# Patient Record
Sex: Female | Born: 1935 | Race: White | Hispanic: No | Smoking: Never smoker
Health system: Southern US, Community
[De-identification: ages and names within clinical notes are randomized; demographics above are authoritative.]

## PROBLEM LIST (undated history)

## (undated) DIAGNOSIS — E785 Hyperlipidemia, unspecified: Secondary | ICD-10-CM

## (undated) DIAGNOSIS — K649 Unspecified hemorrhoids: Secondary | ICD-10-CM

## (undated) DIAGNOSIS — I10 Essential (primary) hypertension: Secondary | ICD-10-CM

## (undated) DIAGNOSIS — G8929 Other chronic pain: Secondary | ICD-10-CM

## (undated) HISTORY — PX: CHOLECYSTECTOMY: SHX55

---

## 1998-06-05 ENCOUNTER — Observation Stay (HOSPITAL_COMMUNITY): Admission: RE | Admit: 1998-06-05 | Discharge: 1998-06-06 | Payer: Self-pay | Admitting: Cardiovascular Disease

## 1998-10-29 ENCOUNTER — Ambulatory Visit (HOSPITAL_COMMUNITY): Admission: RE | Admit: 1998-10-29 | Discharge: 1998-10-29 | Payer: Self-pay | Admitting: Internal Medicine

## 1998-11-12 ENCOUNTER — Encounter: Payer: Self-pay | Admitting: Cardiovascular Disease

## 1998-11-12 ENCOUNTER — Observation Stay (HOSPITAL_COMMUNITY): Admission: RE | Admit: 1998-11-12 | Discharge: 1998-11-13 | Payer: Self-pay | Admitting: Cardiovascular Disease

## 1998-11-18 ENCOUNTER — Ambulatory Visit (HOSPITAL_COMMUNITY): Admission: RE | Admit: 1998-11-18 | Discharge: 1998-11-18 | Payer: Self-pay | Admitting: Internal Medicine

## 1998-11-18 ENCOUNTER — Encounter: Payer: Self-pay | Admitting: Internal Medicine

## 1998-12-24 ENCOUNTER — Ambulatory Visit (HOSPITAL_COMMUNITY): Admission: RE | Admit: 1998-12-24 | Discharge: 1998-12-24 | Payer: Self-pay | Admitting: Gastroenterology

## 1999-02-11 ENCOUNTER — Ambulatory Visit (HOSPITAL_COMMUNITY): Admission: RE | Admit: 1999-02-11 | Discharge: 1999-02-11 | Payer: Self-pay | Admitting: Gastroenterology

## 1999-11-22 ENCOUNTER — Ambulatory Visit (HOSPITAL_COMMUNITY): Admission: RE | Admit: 1999-11-22 | Discharge: 1999-11-22 | Payer: Self-pay | Admitting: Internal Medicine

## 1999-11-22 ENCOUNTER — Encounter: Payer: Self-pay | Admitting: Internal Medicine

## 2000-11-22 ENCOUNTER — Encounter: Payer: Self-pay | Admitting: Internal Medicine

## 2000-11-22 ENCOUNTER — Ambulatory Visit (HOSPITAL_COMMUNITY): Admission: RE | Admit: 2000-11-22 | Discharge: 2000-11-22 | Payer: Self-pay | Admitting: Internal Medicine

## 2000-11-29 ENCOUNTER — Encounter: Admission: RE | Admit: 2000-11-29 | Discharge: 2000-11-29 | Payer: Self-pay | Admitting: Internal Medicine

## 2000-11-29 ENCOUNTER — Encounter: Payer: Self-pay | Admitting: Internal Medicine

## 2001-11-23 ENCOUNTER — Encounter: Payer: Self-pay | Admitting: Internal Medicine

## 2001-11-23 ENCOUNTER — Ambulatory Visit (HOSPITAL_COMMUNITY): Admission: RE | Admit: 2001-11-23 | Discharge: 2001-11-23 | Payer: Self-pay | Admitting: Internal Medicine

## 2002-07-18 ENCOUNTER — Encounter: Admission: RE | Admit: 2002-07-18 | Discharge: 2002-07-18 | Payer: Self-pay | Admitting: Internal Medicine

## 2002-07-18 ENCOUNTER — Encounter: Payer: Self-pay | Admitting: Internal Medicine

## 2002-08-05 ENCOUNTER — Encounter: Admission: RE | Admit: 2002-08-05 | Discharge: 2002-08-05 | Payer: Self-pay | Admitting: Internal Medicine

## 2002-08-05 ENCOUNTER — Encounter: Payer: Self-pay | Admitting: Internal Medicine

## 2002-08-20 ENCOUNTER — Ambulatory Visit (HOSPITAL_COMMUNITY): Admission: RE | Admit: 2002-08-20 | Discharge: 2002-08-20 | Payer: Self-pay | Admitting: Gastroenterology

## 2002-12-06 ENCOUNTER — Encounter: Admission: RE | Admit: 2002-12-06 | Discharge: 2002-12-06 | Payer: Self-pay | Admitting: Internal Medicine

## 2002-12-06 ENCOUNTER — Encounter: Payer: Self-pay | Admitting: Internal Medicine

## 2003-12-23 ENCOUNTER — Encounter: Admission: RE | Admit: 2003-12-23 | Discharge: 2003-12-23 | Payer: Self-pay | Admitting: Internal Medicine

## 2004-12-23 ENCOUNTER — Ambulatory Visit (HOSPITAL_COMMUNITY): Admission: RE | Admit: 2004-12-23 | Discharge: 2004-12-23 | Payer: Self-pay | Admitting: Internal Medicine

## 2005-06-06 ENCOUNTER — Ambulatory Visit (HOSPITAL_COMMUNITY): Admission: RE | Admit: 2005-06-06 | Discharge: 2005-06-06 | Payer: Self-pay | Admitting: Ophthalmology

## 2005-12-28 ENCOUNTER — Ambulatory Visit (HOSPITAL_COMMUNITY): Admission: RE | Admit: 2005-12-28 | Discharge: 2005-12-28 | Payer: Self-pay | Admitting: Internal Medicine

## 2006-03-08 ENCOUNTER — Inpatient Hospital Stay (HOSPITAL_COMMUNITY): Admission: EM | Admit: 2006-03-08 | Discharge: 2006-03-14 | Payer: Self-pay | Admitting: Emergency Medicine

## 2006-11-30 ENCOUNTER — Encounter: Admission: RE | Admit: 2006-11-30 | Discharge: 2006-11-30 | Payer: Self-pay | Admitting: Internal Medicine

## 2007-01-01 ENCOUNTER — Ambulatory Visit (HOSPITAL_COMMUNITY): Admission: RE | Admit: 2007-01-01 | Discharge: 2007-01-01 | Payer: Self-pay | Admitting: Internal Medicine

## 2007-01-09 ENCOUNTER — Encounter: Admission: RE | Admit: 2007-01-09 | Discharge: 2007-01-09 | Payer: Self-pay | Admitting: Internal Medicine

## 2007-07-05 ENCOUNTER — Encounter: Admission: RE | Admit: 2007-07-05 | Discharge: 2007-07-05 | Payer: Self-pay | Admitting: Internal Medicine

## 2007-08-25 ENCOUNTER — Encounter (INDEPENDENT_AMBULATORY_CARE_PROVIDER_SITE_OTHER): Payer: Self-pay | Admitting: Emergency Medicine

## 2007-08-25 ENCOUNTER — Ambulatory Visit: Payer: Self-pay | Admitting: Surgery

## 2007-08-25 ENCOUNTER — Emergency Department (HOSPITAL_COMMUNITY): Admission: EM | Admit: 2007-08-25 | Discharge: 2007-08-25 | Payer: Self-pay | Admitting: Emergency Medicine

## 2008-06-13 ENCOUNTER — Encounter: Admission: RE | Admit: 2008-06-13 | Discharge: 2008-06-13 | Payer: Self-pay | Admitting: Internal Medicine

## 2009-06-15 ENCOUNTER — Ambulatory Visit (HOSPITAL_COMMUNITY): Admission: RE | Admit: 2009-06-15 | Discharge: 2009-06-15 | Payer: Self-pay | Admitting: Internal Medicine

## 2010-09-20 ENCOUNTER — Emergency Department (HOSPITAL_COMMUNITY): Admission: EM | Admit: 2010-09-20 | Discharge: 2010-09-20 | Payer: Self-pay | Admitting: Emergency Medicine

## 2010-09-22 ENCOUNTER — Inpatient Hospital Stay (HOSPITAL_COMMUNITY): Admission: EM | Admit: 2010-09-22 | Discharge: 2010-09-26 | Payer: Self-pay | Admitting: Emergency Medicine

## 2010-09-25 ENCOUNTER — Encounter (INDEPENDENT_AMBULATORY_CARE_PROVIDER_SITE_OTHER): Payer: Self-pay

## 2011-03-09 LAB — CBC
HCT: 42.5 % (ref 36.0–46.0)
Hemoglobin: 14.6 g/dL (ref 12.0–15.0)
RBC: 5.03 MIL/uL (ref 3.87–5.11)
WBC: 6.9 10*3/uL (ref 4.0–10.5)

## 2011-03-09 LAB — BASIC METABOLIC PANEL
BUN: 8 mg/dL (ref 6–23)
CO2: 27 mEq/L (ref 19–32)
Calcium: 9.2 mg/dL (ref 8.4–10.5)
Chloride: 94 mEq/L — ABNORMAL LOW (ref 96–112)
Creatinine, Ser: 0.73 mg/dL (ref 0.4–1.2)
GFR calc non Af Amer: >60 mL/min (ref >60)
Potassium: 3.6 mEq/L (ref 3.5–5.1)

## 2011-03-10 LAB — COMPREHENSIVE METABOLIC PANEL
ALT: 14 U/L (ref 0–35)
ALT: 15 U/L (ref 0–35)
ALT: 16 U/L (ref 0–35)
AST: 16 U/L (ref 0–37)
Albumin: 3.3 g/dL — ABNORMAL LOW (ref 3.5–5.2)
Alkaline Phosphatase: 59 U/L (ref 39–117)
BUN: 12 mg/dL (ref 6–23)
CO2: 27 mEq/L (ref 19–32)
CO2: 27 mEq/L (ref 19–32)
Calcium: 8.7 mg/dL (ref 8.4–10.5)
Calcium: 9.1 mg/dL (ref 8.4–10.5)
Calcium: 9.2 mg/dL (ref 8.4–10.5)
Chloride: 96 mEq/L (ref 96–112)
Creatinine, Ser: 0.85 mg/dL (ref 0.4–1.2)
GFR calc Af Amer: >60 mL/min (ref >60)
GFR calc Af Amer: >60 mL/min (ref >60)
GFR calc non Af Amer: >60 mL/min (ref >60)
Glucose, Bld: 115 mg/dL — ABNORMAL HIGH (ref 70–99)
Glucose, Bld: 147 mg/dL — ABNORMAL HIGH (ref 70–99)
Potassium: 4.5 mEq/L (ref 3.5–5.1)
Sodium: 128 mEq/L — ABNORMAL LOW (ref 135–145)
Sodium: 130 mEq/L — ABNORMAL LOW (ref 135–145)
Total Bilirubin: 0.6 mg/dL (ref 0.3–1.2)
Total Protein: 6.1 g/dL (ref 6.0–8.3)

## 2011-03-10 LAB — DIFFERENTIAL
Basophils Absolute: 0 10*3/uL (ref 0.0–0.1)
Basophils Absolute: 0 10*3/uL (ref 0.0–0.1)
Basophils Relative: 0 % (ref 0–1)
Basophils Relative: 0 % (ref 0–1)
Eosinophils Absolute: 0.1 10*3/uL (ref 0.0–0.7)
Eosinophils Absolute: 0.1 10*3/uL (ref 0.0–0.7)
Eosinophils Relative: 1 % (ref 0–5)
Eosinophils Relative: 2 % (ref 0–5)
Lymphocytes Relative: 18 % (ref 12–46)
Lymphs Abs: 1.2 10*3/uL (ref 0.7–4.0)
Lymphs Abs: 1.5 10*3/uL (ref 0.7–4.0)
Monocytes Absolute: 0.9 10*3/uL (ref 0.1–1.0)
Monocytes Relative: 11 % (ref 3–12)
Neutro Abs: 5.7 10*3/uL (ref 1.7–7.7)
Neutrophils Relative %: 69 % (ref 43–77)
Neutrophils Relative %: 69 % (ref 43–77)

## 2011-03-10 LAB — URINALYSIS, ROUTINE W REFLEX MICROSCOPIC
Bilirubin Urine: NEGATIVE
Glucose, UA: NEGATIVE mg/dL
Hgb urine dipstick: NEGATIVE
Ketones, ur: NEGATIVE mg/dL
Nitrite: NEGATIVE
Protein, ur: NEGATIVE mg/dL
Specific Gravity, Urine: 1.017 (ref 1.005–1.030)
Urobilinogen, UA: 0.2 mg/dL (ref 0.0–1.0)
pH: 7 (ref 5.0–8.0)

## 2011-03-10 LAB — CBC
HCT: 40.8 % (ref 36.0–46.0)
HCT: 40.9 % (ref 36.0–46.0)
Hemoglobin: 13.7 g/dL (ref 12.0–15.0)
Hemoglobin: 14 g/dL (ref 12.0–15.0)
Hemoglobin: 14.2 g/dL (ref 12.0–15.0)
MCH: 29.1 pg (ref 26.0–34.0)
MCH: 29.6 pg (ref 26.0–34.0)
MCHC: 33.5 g/dL (ref 30.0–36.0)
MCHC: 34.8 g/dL (ref 30.0–36.0)
MCV: 85 fL (ref 78.0–100.0)
MCV: 86.8 fL (ref 78.0–100.0)
Platelets: 205 K/uL (ref 150–400)
Platelets: 229 10*3/uL (ref 150–400)
RBC: 4.71 MIL/uL (ref 3.87–5.11)
RBC: 4.8 MIL/uL (ref 3.87–5.11)
RDW: 15 % (ref 11.5–15.5)
WBC: 6.4 K/uL (ref 4.0–10.5)

## 2011-03-10 LAB — COMPREHENSIVE METABOLIC PANEL WITH GFR
ALT: 17 U/L (ref 0–35)
AST: 19 U/L (ref 0–37)
Albumin: 3.5 g/dL (ref 3.5–5.2)
Alkaline Phosphatase: 69 U/L (ref 39–117)
BUN: 10 mg/dL (ref 6–23)
CO2: 27 meq/L (ref 19–32)
Calcium: 9 mg/dL (ref 8.4–10.5)
Chloride: 87 meq/L — ABNORMAL LOW (ref 96–112)
Creatinine, Ser: 0.85 mg/dL (ref 0.4–1.2)
GFR calc non Af Amer: >60 mL/min
Glucose, Bld: 152 mg/dL — ABNORMAL HIGH (ref 70–99)
Potassium: 3.8 meq/L (ref 3.5–5.1)
Sodium: 122 meq/L — ABNORMAL LOW (ref 135–145)
Total Bilirubin: 0.9 mg/dL (ref 0.3–1.2)
Total Protein: 6.4 g/dL (ref 6.0–8.3)

## 2011-03-10 LAB — LIPASE, BLOOD
Lipase: 38 U/L (ref 11–59)
Lipase: 43 U/L (ref 11–59)

## 2011-03-10 LAB — CK TOTAL AND CKMB (NOT AT ARMC)
CK, MB: 1 ng/mL (ref 0.3–4.0)
Total CK: 27 U/L (ref 7–177)

## 2011-03-10 LAB — PLATELET INHIBITION P2Y12
Platelet Function  P2Y12: 218 [PRU] (ref 194–418)
Platelet Function Baseline: 301 [PRU] (ref 194–418)

## 2011-03-10 LAB — TROPONIN I

## 2011-05-13 NOTE — H&P (Signed)
NAMEGEOFFREY, MANKIN NO.:  [id number]   MEDICAL RECORD NO.:  [id number]          PATIENT TYPE:  INP   LOCATION:  1826                         FACILITY:  [facility]   PHYSICIAN:  Isidor Holts, M.D.  DATE OF BIRTH:  1918-01-25   DATE OF ADMISSION:  03/08/2006  DATE OF DISCHARGE:                                HISTORY & PHYSICAL   PRIMARY MEDICAL DOCTOR:  Dr. Elmore Guise   CARDIOLOGIST:  Dr. Nanetta Batty   CHIEF COMPLAINT:  Constipation, fever, urinary incontinence, confusion.   HISTORY OF PRESENT ILLNESS:  This is an 77 year old female. For past medical  history, see below. History is supplied by the patient and her daughter. The  patient states that she had been constipated approximately 1 week ago for 3  days. She took some OTC laxatives, including Milk of Magnesia. This resulted  in bowel movement, but the patient has after that, become progressively  weak. Has had fever/chills for approximately 1 week and in a.m. of March 08, 2006, her neighbors checked on her and found her looking unwell and called  her daughter, who went over and found the patient confused, feverish, and  incontinent of urine. She took the patient to her PMD's office. Temperature  was found to be 102 degrees. Her PMD referred her to the emergency  department.   PAST MEDICAL HISTORY:  1.  Hypertension.  2.  Ulcerative colitis.  3.  DJD.  4.  Glaucoma.  5.  Hiatal hernia.  6.  Peripheral vascular disease, status post left carotid stent      approximately 5 years ago.  7.  Dyslipidemia.  8.  Anxiety.  9.  Colon polyp (adenoma) 2000. This was removed. The patient is status post      negative colonoscopy August 2003.  10. Status post right total knee replacement.   MEDICATIONS:  1.  Plavix 75 mg p.o. daily.  2.  Aspirin 81 mg p.o. daily.  3.  Caduet (5/20) one p.o. daily.  4.  Diovan HCT (320/25) one p.o. daily.  5.  Klonopin 1 mg p.o. q.h.s.   ALLERGIES:  1.  PENICILLIN.  2.   NUBAIN.  3.  CODEINE.  4.  CIPROFLOXACIN.  All of these cause itching.  1.  ZESTRIL. This causes a bad cough.   SYSTEMS REVIEW:  As per HPI and chief complaint. The patient feels  nauseated. Denies cough or shortness of breath. Denies chest pain. Denies  vomiting or diarrhea. Has occasional abdominal pain. Systems review is  otherwise negative.   SOCIAL HISTORY:  The patient is widowed. She lives alone, but neighbors  check in on her daily and her daughter visits often. She is an ex-smoker,  quit approximately 20 years ago. Denies alcohol use or drug abuse. She has  one daughter who is alive and well. She has two sisters, i.e. one full  sister and one half sister, who have arthritis but are otherwise healthy.   FAMILY HISTORY:  Otherwise noncontributory.   PHYSICAL EXAMINATION:  VITAL SIGNS:  T-max 104.1, pulse 101 per minute and  regular, respiratory  rate 22, blood pressure 108/56 mmHg initially,  rechecked with blood pressure 76 mmHg. Pulse oximeter 93% on room air.  GENERAL:  The patient appears alert, communicative, intermittently confused,  not short of breath at rest.  HEENT:  No clinical pallor, no jaundice, no conjunctival injection. Throat  appears quite clear.  NECK:  Supple, JVP not seen. No palpable lymphadenopathy, no palpable  goiter, no carotid bruits.  CHEST:  Clinically clear to auscultation. No wheezes or crackles. Heart  sounds 1 and 2 heard. Normal rate and rhythm, no murmurs.  ABDOMEN:  Obese, soft and nontender. There is no palpable organomegaly, no  palpable masses, normal bowel sounds.  LOWER EXTREMITIES:  No pitting edema, palpable peripheral pulses.  MUSCULOSKELETAL:  Vertical scar is noted over the right knee anteriorly.  Otherwise, she has generalized osteoarthritic changes.  CENTRAL NERVOUS SYSTEM:  No focal neurologic deficit on gross examination.   INVESTIGATIONS:  CBC:  WBC 18.3, hemoglobin 12.6, hematocrit 37.5, platelets  236. Electrolytes:   Sodium 137, potassium 3.1, chloride 101, CO2 23, BUN 31,  creatinine 1.7, glucose 168. Urinalysis shows wbc too numerous to count, rbc  21-50, bacteria many.   ASSESSMENT AND PLAN:  1.  Acute pyelonephritis with possible sepsis syndrome. As evidenced by      pyrexia, elevated WBC, tachycardia, and hypotension. We shall start      intravenous fluid hydration, initiate septic workup including blood      cultures and urine cultures, and do chest x-ray for completeness,      although no respiratory tract symptoms are noted. We have discussed      antibiotic choice in detail with pharmacy, as the patient has pruritus      to PENICILLIN and the QUINOLONES. Recommendation is to try Rocephin.      Creatinine is elevated, militating against the use of aminoglycosides.   1.  Dehydration/Arenal insufficiency. As evidenced by high BUN/creatinine      ratio and hypotension. We shall manage with aggressive intravenous fluid      hydration, admit the patient to stepdown unit for close observation, and      maintain a low threshold for institution of pressors.   1.  Ulcerative colitis. This appears quiescent at present.   1.  Dyslipidemia. Continue pre-admission lipid-lowering medication.   1.  History of hypertension. In view of hypotension, will hold      antihypertensives for now.   Will institute gastrointestinal prophylaxis with proton pump inhibitor and  deep venous thrombosis prophylaxis with Lovenox.   Further management will depend on clinical course.      Isidor Holts, M.D.  Electronically Signed     CO/MEDQ  D:  03/08/2006  T:  03/08/2006  Job:  [id number]   cc:   Erskine Speed, M.D.  Fax: [phone]

## 2011-05-13 NOTE — Discharge Summary (Signed)
[name], [name] NO.:  [id number]   MEDICAL RECORD NO.:  [id number]          PATIENT TYPE:  INP   LOCATION:  3037                         FACILITY:  [facility]   PHYSICIAN:  Hettie Holstein, D.O.    DATE OF BIRTH:  1918/01/29   DATE OF ADMISSION:  03/08/2006  DATE OF DISCHARGE:  03/12/2006                                 DISCHARGE SUMMARY   PRIMARY CARE PHYSICIAN:  Dr. Elmore Guise.   REASON FOR ADMISSION:  Fever, urinary incontinence and confusion.   PRINCIPAL DIAGNOSIS:  Septic shock, Escherichia coli, status post resolution  with intensive care unit course requiring pressor therapy.   MEDICATIONS ON DISCHARGE:  The patient should take -  1.  Keflex 500 mg b.i.d., end date of March 22, 2006.  2.  Glucophage 850 mg daily.  3.  Glipizide 2.5 mg daily.  4.  Albuterol metered dose inhaler two puffs q.i.d. per days as needed.  In addition, she should continue her home medications of -  1.  Plavix 75 mg daily.  2.  Aspirin 81 mg daily.  3.  Diovan HCT 320/25 daily.  4.  Klonopin 1 mg q.h.s.  5.  Caduet 5/20 daily.   ADDITIONAL MEDICAL DIAGNOSES:  1.  History of hypertension.  2.  History of ulcerative colitis.  3.  Peripheral vascular disease, status post left carotid stent 5 years ago.  4.  Anxiety.  5.  Colon polyps.  6.  Hiatal hernia.  7.  Glaucoma.  8.  Status post right total knee replacement.   HISTORY OF PRESENT ILLNESS:  Please refer to history and physical as  dictated by Dr. Isidor Holts; however, [name] is a pleasant 77-year-  old independently-living female with a past medical history significant for  peripheral vascular disease status post left carotid stent, who presented  with constipation, in addition to progressive weakness, fever and chills,  and confusion as reported by her daughter.  She was seen in her primary care  physician's office and her temperature was 102.  She was referred to the  emergency department.   HOSPITAL  COURSE:  She was admitted and initially started on fluid.  She was  hypotensive and then required Levophed, as well as dopamine, and aggressive  fluid hydration.  Her course was than of gradual improvement and weaning of  her pressor.  She was discovered to positive blood cultures for pansensitive  E. coli.  She responded to Rocephin.  She had multiple drug allergy  listings; however, she tolerated both Rocephin, as well as a transition into  her Keflex.  Her functional status is quite good, though she is  deconditioned at this point, and she has newly-  diagnosed diabetes mellitus with a hemoglobin A1C this admission of 6.9.  She is being transitioned to oral therapy, and I suspect that she will be  able to be discharged in the morning if she does continue to have  improvement in her functional status.      Hettie Holstein, D.O.  Electronically Signed     ESS/MEDQ  D:  03/12/2006  T:  03/12/2006  Job:  [id number]   cc:   Erskine Speed, M.D.  Fax: [phone]

## 2011-05-13 NOTE — Op Note (Signed)
[name], [name]                ACCOUNT NO.:  [id number]   MEDICAL RECORD NO.:  [id number]          PATIENT TYPE:  OIB   LOCATION:  2550                         FACILITY:  [facility]   PHYSICIAN:  Alford Highland. Rankin, M.D.   DATE OF BIRTH:  1918/04/24   DATE OF PROCEDURE:  06/06/2005  DATE OF DISCHARGE:                                 OPERATIVE REPORT   PREOPERATIVE DIAGNOSIS:  Epiretinal membrane right eye.   POSTOPERATIVE DIAGNOSES:  Epiretinal membrane right eye.   PROCEDURES:  1.  Posterior vitrectomy with membrane peel OD - 25 gauge.   SURGEON:  Alford Highland. Rankin, M.D.   ANESTHESIA:  Local with monitored anesthesia control.   INDICATION FOR PROCEDURE:  The patient is an 77 year-old woman who has  significant visual loss in the right eye on the basis of idiopathic  epiretinal membrane.  She understands this is an attempt to remove the  epiretinal membrane so as to allow for visual acuity function to improve.  She understands the risks of anesthesia, including rare occurrence of death,  loss to the eye including but not limited to hemorrhage, infection,  scarring, need for further surgery, no change in vision, loss of vision, and  progressive disease despite intervention.  After appropriate signed consent  was obtained, the patient was taken to the operating room.  In the operating  room, appropriate monitors were followed by mild sedation.  0.75% Marcaine  was delivered 5 ml retrobulbar, an additional 5 ml laterally fashioned in  modified [employer]  The right periocular region was sterilely prepped and  draped in the usual ophthalmic fashion.  The microscope was placed in  position, with the biom attached.  A 25-gauge trocar system was then used,  and the initial trocar was placed in the inferotemporal quadrant 3.5 mm  posterior to the limbus.  Placement in the vitreous cavity was verified  visually.  The superior trocars were then placed.  A 25-gauge core  vitrectomy was then begun.   Posterior vitreous was detected and had  spontaneously detached, and the vitreousskirt circumcised 360 degrees.  At  this time, 25-gauge forceps were then used to engage the internal limiting  membrane component of the epiretinal membrane.  All striae of the macular  and perimacular region were removed in this fashion.  No complications  occurred.  At this time, the peripheral retina was inspected and found to be  free of retinal holes or tears.  Under low infusion pressures, trocar system  was removed.  Subconjunctival Decadron was placed inferonasally.   A sterile patch and Fox shield applied to the right eye.  The patient was  taken to the outpatient area and discharged home as an outpatient.      Alford Highland Rankin, M.D.  Electronically Signed    GAR/MEDQ  D:  06/06/2005  T:  06/06/2005  Job:  [id number]

## 2011-05-13 NOTE — Discharge Summary (Signed)
NAMEATALIA, [name] NO.:  [id number]   MEDICAL RECORD NO.:  [id number]          PATIENT TYPE:  INP   LOCATION:  3037                         FACILITY:  [facility]   PHYSICIAN:  Isidor Holts, M.D.  DATE OF BIRTH:  09-01-1918   DATE OF ADMISSION:  03/08/2006  DATE OF DISCHARGE:  03/14/2006                                 DISCHARGE SUMMARY   PRIMARY MEDICAL DOCTOR:  Dr. Elmore Guise.   DISCHARGE DIAGNOSIS:  Refer to interim discharge summary dated March 12, 2006 by Dr. Hannah Beat.   DISCHARGE MEDICATIONS:  Refer to above-mentioned interim discharge.  However, the following modifications have been made:  1.  Glucophage.  This has been discontinued, secondary to patient's age and      propensity for lactic acidosis.  2.  Glipizide.  This has been changed to 5 mg p.o. daily.  3.  Protonix 40 mg p.o. daily.   PROCEDURES:  Refer to interim discharge summary dated March 12, 2006.   ADMISSION HISTORY:  Refer to above-mentioned discharge summary.   CLINICAL COURSE:  Refer to interim discharge summary dated March 12, 2006 by  Dr. Theodoro Grist.  In addition, for the subsequent few days (March 13, 2006  through March 14, 2006), the patient remained clinically stable.  She was  euglycemic on a combination of glipizide and Glucophage.  However, in view  of the patient's age and the possibility of lactic acidosis secondary to  senile nephron loss, it is felt appropriate to discontinue the Glucophage.  The patient has therefore been placed on 5 mg of glipizide instead.  Arrangements have been put in place for discharge.  The patient appears to  have adequate support at home, and has been arranged a home health R.N.  She  has also been recommended to attend the [facility] diabetic teaching  service.  Their phone number is [phone].  She is recommended to follow up  with her primary M.D., Dr. Elmore Guise, within 1 week of discharge.      Isidor Holts, M.D.  Electronically  Signed     CO/MEDQ  D:  03/14/2006  T:  03/14/2006  Job:  [id number]   cc:   Erskine Speed, M.D.  Fax: [phone]

## 2011-10-07 LAB — URINE MICROSCOPIC-ADD ON

## 2011-10-07 LAB — URINALYSIS, ROUTINE W REFLEX MICROSCOPIC
Bilirubin Urine: NEGATIVE
Glucose, UA: NEGATIVE
Ketones, ur: NEGATIVE
Protein, ur: NEGATIVE
pH: 7

## 2012-11-23 ENCOUNTER — Observation Stay (HOSPITAL_COMMUNITY)
Admission: EM | Admit: 2012-11-23 | Discharge: 2012-11-24 | Disposition: A | Discharge status: Home / Self Care | Payer: Medicare Other | Attending: Internal Medicine | Admitting: Internal Medicine

## 2012-11-23 ENCOUNTER — Encounter (HOSPITAL_COMMUNITY): Payer: Self-pay | Admitting: Emergency Medicine

## 2012-11-23 ENCOUNTER — Emergency Department (HOSPITAL_COMMUNITY): Payer: Medicare Other

## 2012-11-23 DIAGNOSIS — I10 Essential (primary) hypertension: Secondary | ICD-10-CM | POA: Diagnosis present

## 2012-11-23 DIAGNOSIS — R5381 Other malaise: Secondary | ICD-10-CM | POA: Insufficient documentation

## 2012-11-23 DIAGNOSIS — K649 Unspecified hemorrhoids: Secondary | ICD-10-CM | POA: Diagnosis present

## 2012-11-23 DIAGNOSIS — Z7902 Long term (current) use of antithrombotics/antiplatelets: Secondary | ICD-10-CM | POA: Insufficient documentation

## 2012-11-23 DIAGNOSIS — K644 Residual hemorrhoidal skin tags: Secondary | ICD-10-CM | POA: Insufficient documentation

## 2012-11-23 DIAGNOSIS — K625 Hemorrhage of anus and rectum: Principal | ICD-10-CM | POA: Insufficient documentation

## 2012-11-23 DIAGNOSIS — E785 Hyperlipidemia, unspecified: Secondary | ICD-10-CM | POA: Insufficient documentation

## 2012-11-23 DIAGNOSIS — K922 Gastrointestinal hemorrhage, unspecified: Secondary | ICD-10-CM | POA: Diagnosis present

## 2012-11-23 DIAGNOSIS — Z79899 Other long term (current) drug therapy: Secondary | ICD-10-CM | POA: Insufficient documentation

## 2012-11-23 HISTORY — DX: Unspecified hemorrhoids: K64.9

## 2012-11-23 HISTORY — DX: Essential (primary) hypertension: I10

## 2012-11-23 HISTORY — DX: Hyperlipidemia, unspecified: E78.5

## 2012-11-23 LAB — POCT I-STAT, CHEM 8
BUN: 20 mg/dL (ref 6–23)
Chloride: 101 mEq/L (ref 96–112)
Creatinine, Ser: 1 mg/dL (ref 0.50–1.10)
Sodium: 139 mEq/L (ref 135–145)

## 2012-11-23 LAB — HEMOGLOBIN AND HEMATOCRIT, BLOOD: HCT: 41.7 % (ref 36.0–46.0)

## 2012-11-23 MED ORDER — ONDANSETRON HCL 4 MG/2ML IJ SOLN: 4.0000 mg | Freq: Four times a day (QID) | INTRAMUSCULAR | Status: DC | PRN

## 2012-11-23 MED ORDER — VALSARTAN 160 MG PO TABS
320.0000 mg | ORAL_TABLET | Freq: Every day | ORAL | Status: DC
  Filled 2012-11-23: qty 2

## 2012-11-23 MED ORDER — ADULT MULTIVITAMIN W/MINERALS CH
1.0000 | ORAL_TABLET | Freq: Every day | ORAL | Status: DC
  Filled 2012-11-23: qty 1

## 2012-11-23 MED ORDER — ATORVASTATIN CALCIUM 20 MG PO TABS
20.0000 mg | ORAL_TABLET | Freq: Every day | ORAL | Status: DC
  Filled 2012-11-23: qty 1

## 2012-11-23 MED ORDER — VALSARTAN-HYDROCHLOROTHIAZIDE 320-25 MG PO TABS: 1.0000 | ORAL_TABLET | Freq: Every day | ORAL | Status: DC

## 2012-11-23 MED ORDER — HYDROCHLOROTHIAZIDE 25 MG PO TABS
25.0000 mg | ORAL_TABLET | Freq: Every day | ORAL | Status: DC
  Filled 2012-11-23: qty 1

## 2012-11-23 MED ORDER — AMLODIPINE BESYLATE 5 MG PO TABS
5.0000 mg | ORAL_TABLET | Freq: Every day | ORAL | Status: DC
  Filled 2012-11-23: qty 1

## 2012-11-23 MED ORDER — SODIUM CHLORIDE 0.9 % IV SOLN
INTRAVENOUS | Status: DC
  Administered 2012-11-23: 50 mL/h via INTRAVENOUS

## 2012-11-23 MED ORDER — DOCUSATE SODIUM 100 MG PO CAPS: 100.0000 mg | ORAL_CAPSULE | Freq: Every day | ORAL | Status: DC | PRN

## 2012-11-23 MED ORDER — GUAIFENESIN-DM 100-10 MG/5ML PO SYRP
5.0000 mL | ORAL_SOLUTION | ORAL | Status: DC | PRN
  Filled 2012-11-23: qty 5

## 2012-11-23 MED ORDER — HYDROCODONE-ACETAMINOPHEN 5-325 MG PO TABS: 1.0000 | ORAL_TABLET | ORAL | Status: DC | PRN

## 2012-11-23 MED ORDER — SODIUM CHLORIDE 0.9 % IV SOLN
INTRAVENOUS | Status: DC
  Administered 2012-11-23: 17:00:00 via INTRAVENOUS

## 2012-11-23 MED ORDER — POLYETHYLENE GLYCOL 3350 17 G PO PACK
17.0000 g | PACK | Freq: Every day | ORAL | Status: DC | PRN
  Filled 2012-11-23: qty 1

## 2012-11-23 MED ORDER — DIPHENHYDRAMINE HCL 25 MG PO CAPS
25.0000 mg | ORAL_CAPSULE | Freq: Once | ORAL | Status: AC
  Administered 2012-11-23: 25 mg via ORAL
  Filled 2012-11-23: qty 1

## 2012-11-23 MED ORDER — ALBUTEROL SULFATE (5 MG/ML) 0.5% IN NEBU: 2.5000 mg | INHALATION_SOLUTION | RESPIRATORY_TRACT | Status: DC | PRN

## 2012-11-23 MED ORDER — ONDANSETRON HCL 4 MG PO TABS: 4.0000 mg | ORAL_TABLET | Freq: Four times a day (QID) | ORAL | Status: DC | PRN

## 2012-11-23 NOTE — H&P (Signed)
[facility]                                                                                    Patient Demographics  [name], is a 78 y.o. female  CSN: [id number]  MRN: [id number]  DOB - [birth year]  Admit Date - 11/23/2012  Outpatient Primary MD for the patient is No primary provider on file.   With History of -  Past Medical History  Diagnosis Date  . Hypertension   . Dyslipidemia   . Hemorrhoid       Past Surgical History  Procedure Date  . Cholecystectomy     in for   Chief Complaint  Patient presents with  . Rectal Bleeding     HPI  [name]  is a 78 y.o. female, who is extremely healthy for her age has past medical history consisting of dyslipidemia, hypertension and hemorrhoids, who says she has been noticing some blood when she wipes herself cleaned after using bathroom for the last few weeks, the bleeding is painless, it's bright red in color, not associated with any symptoms, she has history of polyp removal several years ago, denies any personal or family history of GI malignancies, denies any uterine or pelvic disorder. She was brought in the ER where her rectal exam by the ER physician revealed some bright red blood, I did a pelvic exam and did not notice any blood in the vagina vault, her initial lab work was stable I was called to admit the patient for observation for  lower GI bleed. Patient currently is completely symptom-free.    Review of Systems currently negative review of systems   In addition to the HPI above,   No Fever-chills, No Headache, No changes with Vision or hearing, No problems swallowing food or Liquids, No Chest pain, Cough or Shortness of Breath, No Abdominal pain, No Nausea or Vommitting, Bowel movements are regular, No Blood in stool or Urine, No dysuria, No new skin rashes or bruises, No new joints pains-aches,  No new weakness, tingling, numbness in any extremity, No recent weight gain or loss, No  polyuria, polydypsia or polyphagia, No significant Mental Stressors.  A full 10 point Review of Systems was done, except as stated above, all other Review of Systems were negative.   Social History History  Substance Use Topics  . Smoking status: Never Smoker   . Smokeless tobacco: Not on file  . Alcohol Use: No     Family History Family History  Problem Relation Age of Onset  . Breast cancer Daughter      Prior to Admission medications   Medication Sig Start Date End Date Taking? Authorizing Provider  amLODipine (NORVASC) 5 MG tablet Take 5 mg by mouth daily.   Yes Historical Provider, MD  aspirin EC 81 MG tablet Take 81 mg by mouth daily.   Yes Historical Provider, MD  atorvastatin (LIPITOR) 20 MG tablet Take 20 mg by mouth daily.   Yes Historical Provider, MD  Glucosamine-Chondroitin (OSTEO BI-FLEX REGULAR STRENGTH PO) Take 1 capsule by mouth daily.   Yes Historical Provider, MD  Multiple Vitamin (MULTIVITAMIN WITH MINERALS) TABS Take 1 tablet  by mouth daily.   Yes Historical Provider, MD  Multiple Vitamins-Minerals (ICAPS PO) Take 1 capsule by mouth daily.   Yes Historical Provider, MD  psyllium (METAMUCIL) 58.6 % packet Take 1 packet by mouth daily as needed. laxative and fiber supplement   Yes Historical Provider, MD  valsartan-hydrochlorothiazide (DIOVAN-HCT) 320-25 MG per tablet Take 1 tablet by mouth daily.   Yes Historical Provider, MD    No Known Allergies  Physical Exam  Vitals  Blood pressure 151/63, pulse 71, temperature 98.3 F (36.8 C), temperature source Oral, resp. rate 16, SpO2 95.00%.   1. General pleasant elderly Caucasian female lying in bed in NAD,    2. Normal affect and insight, Not Suicidal or Homicidal, Awake Alert, Oriented X 3.  3. No F.N deficits, ALL C.Nerves Intact, Strength 5/5 all 4 extremities, Sensation intact all 4 extremities, Plantars down going.  4. Ears and Eyes appear Normal, Conjunctivae clear, PERRLA. Moist Oral  Mucosa.  5. Supple Neck, No JVD, No cervical lymphadenopathy appriciated, No Carotid Bruits.  6. Symmetrical Chest wall movement, Good air movement bilaterally, CTAB.  7. RRR, No Gallops, Rubs or Murmurs, No Parasternal Heave.  8. Positive Bowel Sounds, Abdomen Soft, Non tender, No organomegaly appriciated,No rebound -guarding or rigidity. External hemorrhoids, rectal exam by ER physician revealed some bright red blood, my exam revealed no blood, bedside with general exam no blood in the vagina vault.  9.  No Cyanosis, Normal Skin Turgor, No Skin Rash or Bruise.  10. Good muscle tone,  joints appear normal , no effusions, Normal ROM.  11. No Palpable Lymph Nodes in Neck or Axillae    Data Review  CBC  Lab 11/23/12 1145  WBC --  HGB 16.0*  HCT 47.0*  PLT --  MCV --  MCH --  MCHC --  RDW --  LYMPHSABS --  MONOABS --  EOSABS --  BASOSABS --  BANDABS --   ------------------------------------------------------------------------------------------------------------------  Chemistries   Lab 11/23/12 1145  NA 139  K 3.6  CL 101  CO2 --  GLUCOSE 119*  BUN 20  CREATININE 1.00  CALCIUM --  MG --  AST --  ALT --  ALKPHOS --  BILITOT --   ------------------------------------------------------------------------------------------------------------------ CrCl is unknown because there is no height on file for the current visit. ------------------------------------------------------------------------------------------------------------------ No results found for this basename: TSH,T4TOTAL,FREET3,T3FREE,THYROIDAB in the last 72 hours   Coagulation profile No results found for this basename: INR:5,PROTIME:5 in the last 168 hours ------------------------------------------------------------------------------------------------------------------- No results found for this basename: DDIMER:2 in the last 72  hours -------------------------------------------------------------------------------------------------------------------  Cardiac Enzymes No results found for this basename: CK:3,CKMB:3,TROPONINI:3,MYOGLOBIN:3 in the last 168 hours ------------------------------------------------------------------------------------------------------------------ No components found with this basename: POCBNP:3   ---------------------------------------------------------------------------------------------------------------  Urinalysis    Component Value Date/Time   COLORURINE YELLOW 09/20/2010 2114   APPEARANCEUR CLEAR 09/20/2010 2114   LABSPEC 1.017 09/20/2010 2114   PHURINE 7.0 09/20/2010 2114   GLUCOSEU NEGATIVE 09/20/2010 2114   HGBUR NEGATIVE 09/20/2010 2114   BILIRUBINUR NEGATIVE 09/20/2010 2114   KETONESUR NEGATIVE 09/20/2010 2114   PROTEINUR NEGATIVE 09/20/2010 2114   UROBILINOGEN 0.2 09/20/2010 2114   NITRITE NEGATIVE 09/20/2010 2114   LEUKOCYTESUR NEGATIVE MICROSCOPIC NOT DONE ON URINES WITH NEGATIVE PROTEIN, BLOOD, LEUKOCYTES, NITRITE, OR GLUCOSE <1000 mg/dL. 09/20/2010 2114    ----------------------------------------------------------------------------------------------------------------  Imaging results:   Dg Abd 1 View  11/23/2012  *RADIOLOGY REPORT*  Clinical Data: Abdominal pain.  ABDOMEN - 1 VIEW  Comparison: Chest radiograph 09/14/2010 and abdominal ultrasound 09/20/2010  Findings: Cholecystectomy  clips are present in the right upper quadrant.  Probable vascular stent noted in the midline into the right of midline at the level of the L2, possibly a right renal artery stent.  There is a curvilinear calcification to the left of midline at L2. This could be calcification within the abdominal aorta or a branch vessel.  The bowel gas pattern is nonobstructive.  There is a moderate amount of stool in the proximal colon.  No evidence of fecal impaction.  Probable phlebolith in the lower right pelvis.  Atherosclerotic calcification of the left iliac artery vasculature. Multilevel degenerative change of the lumbar spine and slight convex right scoliosis.  IMPRESSION:  1.  Nonobstructive bowel gas pattern.  Moderate amount of stool in the rectum. 2.  Curvilinear calcification in the left abdomen could be within a branch vessel of the aorta.  Calcification within a tortuous or aneurysmal abdominal aorta cannot be excluded by plain films. There are no prior abdominal radiographs for comparison. 4.  Prior cholecystectomy.   Original Report Authenticated By: Britta Mccreedy, M.D.         Assessment & Plan   1. Bright red blood per rectum most likely due to hemorrhoidal bleed - patient is symptom free, hemodynamically stable, H&H stable, will be kept here for 23 hour observation, [facility] has been consulted by ER, I believe patient will benefit from sigmoidoscopy, will check INR, type screen, monitor H&H, clear diet for today, KUB stable, some stool on KUB for which she will be placed on bowel regimen.    2. History of hypertension and dyslipidemia continue home medications and monitor blood pressure.      DVT Prophylaxis SCDs    AM Labs Ordered, also please review Full Orders  Family Communication: Admission, patients condition and plan of care including tests being ordered have been discussed with the patient and grand daughter who indicate understanding and agree with the plan and Code Status.  Code Status DNR  Disposition Plan: home  Time spent in minutes : 35  Condition Marinell Blight K M.D on 11/23/2012 at 1:34 PM  Between 7am to 7pm - Pager - [phone]  After 7pm go to [email or web address] - password [email or web address]  And look for the night coverage person covering me after hours  [facility] Office  [phone]

## 2012-11-23 NOTE — ED Provider Notes (Signed)
History     CSN: [id number]  Arrival date & time 11/23/12  1009   First MD Initiated Contact with Patient 11/23/12 1033      Chief Complaint  Patient presents with  . Rectal Bleeding    (Consider location/radiation/quality/duration/timing/severity/associated sxs/prior treatment) HPI Comments: Patient presents with a two-week history of intermittent rectal bleeding. She states over last 2 days it's gotten more persistent with blood every time she sits down on the toilet. She states the blood fills the toilet. She does some increased weakness over the last 2 weeks. Denies any dizziness. Denies any abdominal pain. Denies any chest pain or shortness of breath. She's had a history of hemorrhoids which have bled before but not to this extent. She denies any other colon problems. She had a colonoscopy about 5 years ago by Dr. Barnett Abu. Her primary care physician Dr. Nila Nephew.  Patient is a 78 y.o. female presenting with hematochezia.  Rectal Bleeding  Pertinent negatives include no fever, no abdominal pain, no diarrhea, no nausea, no rectal pain, no vomiting, no hematuria, no chest pain, no headaches, no coughing and no rash.    Past Medical History  Diagnosis Date  . Hypertension     History reviewed. No pertinent past surgical history.  History reviewed. No pertinent family history.  History  Substance Use Topics  . Smoking status: Never Smoker   . Smokeless tobacco: Not on file  . Alcohol Use: No    OB History    Grav Para Term Preterm Abortions TAB SAB Ect Mult Living                  Review of Systems  Constitutional: Positive for fatigue. Negative for fever, chills and diaphoresis.  HENT: Negative for congestion, rhinorrhea and sneezing.   Eyes: Negative.   Respiratory: Negative for cough, chest tightness and shortness of breath.   Cardiovascular: Negative for chest pain and leg swelling.  Gastrointestinal: Positive for blood in stool and hematochezia. Negative for  nausea, vomiting, abdominal pain, diarrhea and rectal pain.  Genitourinary: Negative for frequency, hematuria, flank pain and difficulty urinating.  Musculoskeletal: Negative for back pain and arthralgias.  Skin: Negative for rash.  Neurological: Negative for dizziness, speech difficulty, weakness, numbness and headaches.    Allergies  Review of patient's allergies indicates no known allergies.  Home Medications   Current Outpatient Rx  Name  Route  Sig  Dispense  Refill  . AMLODIPINE BESYLATE 5 MG PO TABS   Oral   Take 5 mg by mouth daily.         . ASPIRIN EC 81 MG PO TBEC   Oral   Take 81 mg by mouth daily.         . ATORVASTATIN CALCIUM 20 MG PO TABS   Oral   Take 20 mg by mouth daily.         . OSTEO BI-FLEX REGULAR STRENGTH PO   Oral   Take 1 capsule by mouth daily.         . ADULT MULTIVITAMIN W/MINERALS CH   Oral   Take 1 tablet by mouth daily.         . ICAPS PO   Oral   Take 1 capsule by mouth daily.         . PSYLLIUM 58.6 % PO PACK   Oral   Take 1 packet by mouth daily as needed. laxative and fiber supplement         . VALSARTAN-HYDROCHLOROTHIAZIDE  320-25 MG PO TABS   Oral   Take 1 tablet by mouth daily.           BP 151/63  Pulse 71  Temp 98.3 F (36.8 C) (Oral)  Resp 16  SpO2 95%  Physical Exam  Constitutional: She is oriented to person, place, and time. She appears well-developed and well-nourished.  HENT:  Head: Normocephalic and atraumatic.  Eyes: Pupils are equal, round, and reactive to light.  Neck: Normal range of motion. Neck supple.  Cardiovascular: Normal rate, regular rhythm and normal heart sounds.   Pulmonary/Chest: Effort normal and breath sounds normal. No respiratory distress. She has no wheezes. She has no rales. She exhibits no tenderness.  Abdominal: Soft. Bowel sounds are normal. There is no tenderness. There is no rebound and no guarding.  Genitourinary:       Patient has some soft, nontender external  hemorrhoids. No active bleeding is noted. She has brown stool that is mixed with blood. No melena  Musculoskeletal: Normal range of motion. She exhibits no edema.  Lymphadenopathy:    She has no cervical adenopathy.  Neurological: She is alert and oriented to person, place, and time.  Skin: Skin is warm and dry. No rash noted.  Psychiatric: She has a normal mood and affect.    ED Course  Procedures (including critical care time)  Results for orders placed during the hospital encounter of 11/23/12  POCT I-STAT, CHEM 8      Component Value Range   Sodium 139  135 - 145 mEq/L   Potassium 3.6  3.5 - 5.1 mEq/L   Chloride 101  96 - 112 mEq/L   BUN 20  6 - 23 mg/dL   Creatinine, Ser 2.53  0.50 - 1.10 mg/dL   Glucose, Bld 664 (*) 70 - 99 mg/dL   Calcium, Ion 4.03  4.74 - 1.30 mmol/L   TCO2 27  0 - 100 mmol/L   Hemoglobin 16.0 (*) 12.0 - 15.0 g/dL   HCT 25.9 (*) 56.3 - 87.5 %   No results found.   1. GI bleed       MDM  Pt with lower GI bleeding.  Hgb okay.  Discussed with Dr Madilyn Fireman with GI who will consult on pt.  Will discuss with hospitalist for admission        Rolan Bucco, MD 11/23/12 1232

## 2012-11-23 NOTE — Progress Notes (Signed)
  CARE MANAGEMENT ED NOTE 11/23/2012  Patient:  [name], [name]   Account Number:  [id number]  Date Initiated:  11/23/2012  Documentation initiated by:  Edd Arbour  Subjective/Objective Assessment:   78 yr old female medicare pt not listed with pcp  Her primary care physician Dr. Nila Nephew. per EDP on 11/23/12     Subjective/Objective Assessment Detail:     Action/Plan:   EPIC updated   Action/Plan Detail:   Anticipated DC Date:  11/23/2012     Status Recommendation to Physician:   Result of Recommendation:    Other ED Services  Consult Working Plan    DC Planning Services  Other  PCP issues    Choice offered to / List presented to:            Status of service:  Completed, signed off  ED Comments:   ED Comments Detail:

## 2012-11-23 NOTE — Consult Note (Signed)
[facility] Consult Note  Referring Provider: No ref. provider found Primary Care Physician:  Enrique Sack, MD Primary Gastroenterologist:  Dr.  Antony Contras Complaint: Rectal bleeding HPI: [name] is an 78 y.o. white female  who presents with intermittent rectal bleeding which became more dramatic in the last 24 hours with blood running down her leg when she would get off the commode. Her hemoglobin was 16 and she was hemodynamically stable. She is 78 and lives alone. She had a flexible sigmoidoscopy 3 years ago which showed some proctitis and she was treated with Anusol HC suppositories. Her last colonoscopy was 10 years ago.  Past Medical History  Diagnosis Date  . Hypertension   . Dyslipidemia   . Hemorrhoid     Past Surgical History  Procedure Date  . Cholecystectomy     Medications Prior to Admission  Medication Sig Dispense Refill  . amLODipine (NORVASC) 5 MG tablet Take 5 mg by mouth daily.      Marland Kitchen aspirin EC 81 MG tablet Take 81 mg by mouth daily.      Marland Kitchen atorvastatin (LIPITOR) 20 MG tablet Take 20 mg by mouth daily.      . Glucosamine-Chondroitin (OSTEO BI-FLEX REGULAR STRENGTH PO) Take 1 capsule by mouth daily.      . Multiple Vitamin (MULTIVITAMIN WITH MINERALS) TABS Take 1 tablet by mouth daily.      . Multiple Vitamins-Minerals (ICAPS PO) Take 1 capsule by mouth daily.      . psyllium (METAMUCIL) 58.6 % packet Take 1 packet by mouth daily as needed. laxative and fiber supplement      . valsartan-hydrochlorothiazide (DIOVAN-HCT) 320-25 MG per tablet Take 1 tablet by mouth daily.        Allergies: No Known Allergies  Family History  Problem Relation Age of Onset  . Breast cancer Daughter     Social History:  reports that she has never smoked. She does not have any smokeless tobacco history on file. She reports that she does not drink alcohol or use illicit drugs.  Review of Systems: negative except as above   Blood pressure 186/71, pulse 71,  temperature 97.6 F (36.4 C), temperature source Oral, resp. rate 20, height 5\' 4"  (1.626 m), weight 70.171 kg (154 lb 11.2 oz), SpO2 98.00%. Head: Normocephalic, without obvious abnormality, atraumatic Neck: no adenopathy, no carotid bruit, no JVD, supple, symmetrical, trachea midline and thyroid not enlarged, symmetric, no tenderness/mass/nodules Resp: clear to auscultation bilaterally Cardio: regular rate and rhythm, S1, S2 normal, no murmur, click, rub or gallop GI: Abdomen soft. Rectal exam done by previous examiner Extremities: extremities normal, atraumatic, no cyanosis or edema  Results for orders placed during the hospital encounter of 11/23/12 (from the past 48 hour(s))  POCT I-STAT, CHEM 8     Status: Abnormal   Collection Time   11/23/12 11:45 AM      Component Value Range Comment   Sodium 139  135 - 145 mEq/L    Potassium 3.6  3.5 - 5.1 mEq/L    Chloride 101  96 - 112 mEq/L    BUN 20  6 - 23 mg/dL    Creatinine, Ser 1.61  0.50 - 1.10 mg/dL    Glucose, Bld 096 (*) 70 - 99 mg/dL    Calcium, Ion 0.45  4.09 - 1.30 mmol/L    TCO2 27  0 - 100 mmol/L    Hemoglobin 16.0 (*) 12.0 - 15.0 g/dL    HCT 81.1 (*) 91.4 -  46.0 %   HEMOGLOBIN AND HEMATOCRIT, BLOOD     Status: Normal   Collection Time   11/23/12  2:22 PM      Component Value Range Comment   Hemoglobin 13.4  12.0 - 15.0 g/dL    HCT 40.9  81.1 - 91.4 %   PROTIME-INR     Status: Normal   Collection Time   11/23/12  2:22 PM      Component Value Range Comment   Prothrombin Time 14.1  11.6 - 15.2 seconds    INR 1.10  0.00 - 1.49   TYPE AND SCREEN     Status: Normal (Preliminary result)   Collection Time   11/23/12  3:30 PM      Component Value Range Comment   ABO/RH(D) O POS      Antibody Screen NEG      Sample Expiration 11/26/2012      Unit Number [id number]      Blood Component Type RBC LR PHER1      Unit division 00      Status of Unit ALLOCATED      Transfusion Status OK TO TRANSFUSE      Crossmatch Result  Compatible      Unit Number [id number]      Blood Component Type RBC LR PHER1      Unit division 00      Status of Unit ALLOCATED      Transfusion Status OK TO TRANSFUSE      Crossmatch Result Compatible     ABO/RH     Status: Normal   Collection Time   11/23/12  3:30 PM      Component Value Range Comment   ABO/RH(D) O POS      Dg Abd 1 View  11/23/2012  *RADIOLOGY REPORT*  Clinical Data: Abdominal pain.  ABDOMEN - 1 VIEW  Comparison: Chest radiograph 09/14/2010 and abdominal ultrasound 09/20/2010  Findings: Cholecystectomy clips are present in the right upper quadrant.  Probable vascular stent noted in the midline into the right of midline at the level of the L2, possibly a right renal artery stent.  There is a curvilinear calcification to the left of midline at L2. This could be calcification within the abdominal aorta or a branch vessel.  The bowel gas pattern is nonobstructive.  There is a moderate amount of stool in the proximal colon.  No evidence of fecal impaction.  Probable phlebolith in the lower right pelvis. Atherosclerotic calcification of the left iliac artery vasculature. Multilevel degenerative change of the lumbar spine and slight convex right scoliosis.  IMPRESSION:  1.  Nonobstructive bowel gas pattern.  Moderate amount of stool in the rectum. 2.  Curvilinear calcification in the left abdomen could be within a branch vessel of the aorta.  Calcification within a tortuous or aneurysmal abdominal aorta cannot be excluded by plain films. There are no prior abdominal radiographs for comparison. 4.  Prior cholecystectomy.   Original Report Authenticated By: Britta Mccreedy, M.D.     Assessment: Rectal bleeding probably distal rectal or perianal Plan:  Flexible sigmoidoscopy without prep tomorrow. Arelia Volpe C 11/23/2012, 6:48 PM

## 2012-11-23 NOTE — ED Notes (Signed)
Pt c/o rectal bleeding from hemorrhoids x 2 days; pt sts generalized weakness

## 2012-11-24 ENCOUNTER — Encounter (HOSPITAL_COMMUNITY)
Admission: EM | Disposition: A | Discharge status: Home / Self Care | Payer: Self-pay | Source: Home / Self Care | Attending: Emergency Medicine

## 2012-11-24 ENCOUNTER — Encounter (HOSPITAL_COMMUNITY): Payer: Self-pay | Admitting: *Deleted

## 2012-11-24 DIAGNOSIS — K649 Unspecified hemorrhoids: Secondary | ICD-10-CM

## 2012-11-24 DIAGNOSIS — E785 Hyperlipidemia, unspecified: Secondary | ICD-10-CM

## 2012-11-24 DIAGNOSIS — K922 Gastrointestinal hemorrhage, unspecified: Secondary | ICD-10-CM

## 2012-11-24 DIAGNOSIS — I10 Essential (primary) hypertension: Secondary | ICD-10-CM

## 2012-11-24 HISTORY — PX: FLEXIBLE SIGMOIDOSCOPY: SHX5431

## 2012-11-24 LAB — CBC
HCT: 40.4 % (ref 36.0–46.0)
Hemoglobin: 13.3 g/dL (ref 12.0–15.0)
MCH: 28.2 pg (ref 26.0–34.0)
MCV: 85.8 fL (ref 78.0–100.0)
Platelets: 260 10*3/uL (ref 150–400)
RBC: 4.71 MIL/uL (ref 3.87–5.11)

## 2012-11-24 LAB — BASIC METABOLIC PANEL
CO2: 31 mEq/L (ref 19–32)
Calcium: 9.5 mg/dL (ref 8.4–10.5)
Chloride: 98 mEq/L (ref 96–112)
Glucose, Bld: 118 mg/dL — ABNORMAL HIGH (ref 70–99)
Sodium: 138 mEq/L (ref 135–145)

## 2012-11-24 LAB — HEMOGLOBIN AND HEMATOCRIT, BLOOD
HCT: 40.8 % (ref 36.0–46.0)
Hemoglobin: 13.4 g/dL (ref 12.0–15.0)

## 2012-11-24 SURGERY — SIGMOIDOSCOPY, FLEXIBLE
Anesthesia: Moderate Sedation

## 2012-11-24 MED ORDER — FENTANYL CITRATE 0.05 MG/ML IJ SOLN
INTRAMUSCULAR | Status: AC
  Filled 2012-11-24: qty 2

## 2012-11-24 MED ORDER — MIDAZOLAM HCL 10 MG/2ML IJ SOLN
INTRAMUSCULAR | Status: DC | PRN
  Administered 2012-11-24: 2.5 mg via INTRAVENOUS

## 2012-11-24 MED ORDER — FENTANYL CITRATE 0.05 MG/ML IJ SOLN
INTRAMUSCULAR | Status: DC | PRN
  Administered 2012-11-24 (×2): 25 ug via INTRAVENOUS

## 2012-11-24 MED ORDER — MIDAZOLAM HCL 5 MG/ML IJ SOLN
INTRAMUSCULAR | Status: AC
  Filled 2012-11-24: qty 1

## 2012-11-24 MED ORDER — HYDROCORTISONE 2.5 % RE CREA: TOPICAL_CREAM | Freq: Two times a day (BID) | RECTAL | Status: DC

## 2012-11-24 NOTE — Op Note (Signed)
[name], [address]   FLEXIBLE SIGMOIDOSCOPY PROCEDURE REPORT  PATIENT: [name], [name]  MR#: [id number] BIRTHDATE: 1918-10-03 , 89  yrs. old GENDER: Female ENDOSCOPIST: Dorena Cookey, MD REFERRED BY: PROCEDURE DATE:  11/24/2012 PROCEDURE: ASA CLASS: INDICATIONS: rectal bleeding MEDICATIONS:   fentanyl 62-1/2 mcg, Versed 5 mg.  DESCRIPTION OF PROCEDURE:   unprepped procedure to 25 cm. There were moderately enlarged external hemorrhoids and mildly enlarged internal hemorrhoids with a little bit of old blood around the anal verge internally which washed away with no source of bleeding seen. Otherwise the rectal and rectosigmoid mucosa was normal to 25 cm. Limited by stool above that area. There was solid brown stool with no blood mixed in and no blood seen within the lumen of the colon other than right at the anal verge.      COMPLICATIONS:  None  ENDOSCOPIC IMPRESSION: very likely external or internal hemorrhoidal bleeding with no evidence of colitis and no signs of diverticular or other significant internal bleeding.  RECOMMENDATIONS:  topical therapy with suppositories and cream, Tucks pads etc.   _______________________________ Rosalie DoctorDorena Cookey, MD 11/24/2012 10:01 AM

## 2012-11-24 NOTE — Progress Notes (Signed)
Discharge instructions reviewed with and given to patient, who was able to verbalize discharge instructions.  Patient had no complaints of pain or discomforts, eating lunch.  Granddaughter assist with transportation to home.  Venia Riveron Terral

## 2012-11-24 NOTE — Progress Notes (Signed)
Patient ID: TASSY JASON, female   DOB: December 03, 1918, 78 y.o.   MRN: [id number] Flexible sigmoidoscopy done today. She had a fairly prominent external hemorrhoid with an excoriated area that may have been the site of bleeding. There also were some mildly prominent internal hemorrhoids with a little bit of old blood around the anal verge which washed off with no further bleeding. The rectal mucosa up to 25 cm was otherwise normal. There was no old blood in the lumen there were some solid normal-appearing stool. I believe that her leading is diverticular and low-grade and she can be discharged today on Anusol [facility] suppositories and cream or the equivalent. We'll sign off for now.

## 2012-11-24 NOTE — Discharge Summary (Signed)
Physician Discharge Summary  Patient ID: [name] MRN: [id number] DOB/AGE: [birth year] 78 y.o.  Admit date: 11/23/2012 Discharge date: 11/24/2012  Primary Care Physician:  Enrique Sack, MD   Discharge Diagnoses:    Principal Problem:  *LGI bleed Active Problems:  Hypertension  Hemorrhoid  Dyslipidemia      Medication List     As of 11/24/2012 10:42 AM    TAKE these medications         amLODipine 5 MG tablet   Commonly known as: NORVASC   Take 5 mg by mouth daily.      aspirin EC 81 MG tablet   Take 81 mg by mouth daily.      atorvastatin 20 MG tablet   Commonly known as: LIPITOR   Take 20 mg by mouth daily.      hydrocortisone 2.5 % rectal cream   Commonly known as: ANUSOL-HC   Place rectally 2 (two) times daily.      ICAPS PO   Take 1 capsule by mouth daily.      multivitamin with minerals Tabs   Take 1 tablet by mouth daily.      OSTEO BI-FLEX REGULAR STRENGTH PO   Take 1 capsule by mouth daily.      psyllium 58.6 % packet   Commonly known as: METAMUCIL   Take 1 packet by mouth daily as needed. laxative and fiber supplement      valsartan-hydrochlorothiazide 320-25 MG per tablet   Commonly known as: DIOVAN-HCT   Take 1 tablet by mouth daily.         Disposition and Follow-up:  Will be discharged home today in stable condition. To follow up with her PCP in 2 weeks.  Consults:  Dr. Madilyn Fireman, GI.   Significant Diagnostic Studies:  Dg Abd 1 View  11/23/2012  *RADIOLOGY REPORT*  Clinical Data: Abdominal pain.  ABDOMEN - 1 VIEW  Comparison: Chest radiograph 09/14/2010 and abdominal ultrasound 09/20/2010  Findings: Cholecystectomy clips are present in the right upper quadrant.  Probable vascular stent noted in the midline into the right of midline at the level of the L2, possibly a right renal artery stent.  There is a curvilinear calcification to the left of midline at L2. This could be calcification within the abdominal aorta or a branch  vessel.  The bowel gas pattern is nonobstructive.  There is a moderate amount of stool in the proximal colon.  No evidence of fecal impaction.  Probable phlebolith in the lower right pelvis. Atherosclerotic calcification of the left iliac artery vasculature. Multilevel degenerative change of the lumbar spine and slight convex right scoliosis.  IMPRESSION:  1.  Nonobstructive bowel gas pattern.  Moderate amount of stool in the rectum. 2.  Curvilinear calcification in the left abdomen could be within a branch vessel of the aorta.  Calcification within a tortuous or aneurysmal abdominal aorta cannot be excluded by plain films. There are no prior abdominal radiographs for comparison. 4.  Prior cholecystectomy.   Original Report Authenticated By: Britta Mccreedy, M.D.     Brief H and P: For complete details please refer to admission H and P, but in brief patient is a 78 y.o. female, who is extremely healthy for her age has past medical history consisting of dyslipidemia, hypertension and hemorrhoids, who says she has been noticing some blood when she wipes herself cleaned after using bathroom for the last few weeks, the bleeding is painless, it's bright red in color, not associated with any symptoms,  she has history of polyp removal several years ago, denies any personal or family history of GI malignancies, denies any uterine or pelvic disorder. She was brought in the ER where her rectal exam by the ER physician revealed some bright red blood, I did a pelvic exam and did not notice any blood in the vagina vault, her initial lab work was stable I was called to admit the patient for observation for lower GI bleed. Patient currently is completely symptom-free. We were asked to admit her for further evaluation and management.     Hospital Course:  Principal Problem:  *LGI bleed Active Problems:  Hypertension  Hemorrhoid  Dyslipidemia   Rectal Bleed -Presumed from large external hemorrhoid seen on flex  sig. -Anusol cream PRN. -No further work up at this point. -Does not need GI follow up. -Hb has been stable at 13. -No blood transfusions have been required.  Rest of chronic issues have been stable and her home meds have not been changed.  Time spent on Discharge: Greater than 30 minutes.  SignedChaya Jan [facility] Pager: [phone] 11/24/2012, 10:42 AM

## 2012-11-26 ENCOUNTER — Encounter (HOSPITAL_COMMUNITY): Payer: Self-pay | Admitting: Gastroenterology

## 2012-11-26 ENCOUNTER — Encounter (HOSPITAL_COMMUNITY): Payer: Self-pay

## 2012-11-26 LAB — TYPE AND SCREEN
ABO/RH(D): O POS
Unit division: 0

## 2013-11-04 ENCOUNTER — Other Ambulatory Visit: Payer: Self-pay | Admitting: Orthopedic Surgery

## 2013-11-04 DIAGNOSIS — M25562 Pain in left knee: Secondary | ICD-10-CM

## 2013-11-14 ENCOUNTER — Ambulatory Visit
Admission: RE | Admit: 2013-11-14 | Discharge: 2013-11-14 | Disposition: A | Discharge status: Home / Self Care | Payer: Medicare Other | Source: Ambulatory Visit | Attending: Orthopedic Surgery | Admitting: Orthopedic Surgery

## 2013-11-14 DIAGNOSIS — M25562 Pain in left knee: Secondary | ICD-10-CM

## 2014-07-09 ENCOUNTER — Emergency Department (HOSPITAL_COMMUNITY): Payer: Medicare Other

## 2014-07-09 ENCOUNTER — Emergency Department (HOSPITAL_COMMUNITY)
Admission: EM | Admit: 2014-07-09 | Discharge: 2014-07-09 | Disposition: A | Discharge status: Home / Self Care | Payer: Medicare Other | Attending: Family Medicine | Admitting: Family Medicine

## 2014-07-09 ENCOUNTER — Encounter (HOSPITAL_COMMUNITY): Payer: Self-pay | Admitting: Emergency Medicine

## 2014-07-09 ENCOUNTER — Telehealth: Payer: Self-pay | Admitting: *Deleted

## 2014-07-09 DIAGNOSIS — E785 Hyperlipidemia, unspecified: Secondary | ICD-10-CM | POA: Insufficient documentation

## 2014-07-09 DIAGNOSIS — R5381 Other malaise: Secondary | ICD-10-CM | POA: Insufficient documentation

## 2014-07-09 DIAGNOSIS — R109 Unspecified abdominal pain: Secondary | ICD-10-CM | POA: Insufficient documentation

## 2014-07-09 DIAGNOSIS — I10 Essential (primary) hypertension: Secondary | ICD-10-CM | POA: Insufficient documentation

## 2014-07-09 DIAGNOSIS — Z79899 Other long term (current) drug therapy: Secondary | ICD-10-CM | POA: Insufficient documentation

## 2014-07-09 DIAGNOSIS — K649 Unspecified hemorrhoids: Secondary | ICD-10-CM | POA: Insufficient documentation

## 2014-07-09 DIAGNOSIS — R222 Localized swelling, mass and lump, trunk: Secondary | ICD-10-CM | POA: Insufficient documentation

## 2014-07-09 DIAGNOSIS — R918 Other nonspecific abnormal finding of lung field: Secondary | ICD-10-CM

## 2014-07-09 DIAGNOSIS — R531 Weakness: Secondary | ICD-10-CM

## 2014-07-09 DIAGNOSIS — Z7982 Long term (current) use of aspirin: Secondary | ICD-10-CM | POA: Insufficient documentation

## 2014-07-09 DIAGNOSIS — R001 Bradycardia, unspecified: Secondary | ICD-10-CM

## 2014-07-09 DIAGNOSIS — I498 Other specified cardiac arrhythmias: Secondary | ICD-10-CM | POA: Insufficient documentation

## 2014-07-09 DIAGNOSIS — R5383 Other fatigue: Principal | ICD-10-CM

## 2014-07-09 LAB — COMPREHENSIVE METABOLIC PANEL
ALT: 12 U/L (ref 0–35)
ANION GAP: 13 (ref 5–15)
AST: 18 U/L (ref 0–37)
Albumin: 2.7 g/dL — ABNORMAL LOW (ref 3.5–5.2)
Alkaline Phosphatase: 89 U/L (ref 39–117)
BILIRUBIN TOTAL: 0.3 mg/dL (ref 0.3–1.2)
BUN: 17 mg/dL (ref 6–23)
CALCIUM: 8.9 mg/dL (ref 8.4–10.5)
CHLORIDE: 96 meq/L (ref 96–112)
CO2: 28 meq/L (ref 19–32)
CREATININE: 0.99 mg/dL (ref 0.50–1.10)
GFR, EST AFRICAN AMERICAN: 55 mL/min — AB (ref >90)
GFR, EST NON AFRICAN AMERICAN: 47 mL/min — AB (ref >90)
GLUCOSE: 184 mg/dL — AB (ref 70–99)
Potassium: 4.6 mEq/L (ref 3.7–5.3)
Sodium: 137 mEq/L (ref 137–147)
Total Protein: 6.5 g/dL (ref 6.0–8.3)

## 2014-07-09 LAB — CBC WITH DIFFERENTIAL/PLATELET
BASOS ABS: 0 10*3/uL (ref 0.0–0.1)
Basophils Relative: 0 % (ref 0–1)
EOS PCT: 6 % — AB (ref 0–5)
Eosinophils Absolute: 0.4 10*3/uL (ref 0.0–0.7)
HEMATOCRIT: 35.1 % — AB (ref 36.0–46.0)
HEMOGLOBIN: 11 g/dL — AB (ref 12.0–15.0)
LYMPHS PCT: 20 % (ref 12–46)
Lymphs Abs: 1.5 10*3/uL (ref 0.7–4.0)
MCH: 26.6 pg (ref 26.0–34.0)
MCHC: 31.3 g/dL (ref 30.0–36.0)
MCV: 84.8 fL (ref 78.0–100.0)
MONO ABS: 0.7 10*3/uL (ref 0.1–1.0)
MONOS PCT: 10 % (ref 3–12)
Neutro Abs: 4.8 10*3/uL (ref 1.7–7.7)
Neutrophils Relative %: 64 % (ref 43–77)
Platelets: 440 10*3/uL — ABNORMAL HIGH (ref 150–400)
RBC: 4.14 MIL/uL (ref 3.87–5.11)
RDW: 16.4 % — AB (ref 11.5–15.5)
WBC: 7.5 10*3/uL (ref 4.0–10.5)

## 2014-07-09 LAB — PROTIME-INR
INR: 1.15 (ref 0.00–1.49)
Prothrombin Time: 14.7 seconds (ref 11.6–15.2)

## 2014-07-09 LAB — URINALYSIS, ROUTINE W REFLEX MICROSCOPIC
BILIRUBIN URINE: NEGATIVE
GLUCOSE, UA: NEGATIVE mg/dL
HGB URINE DIPSTICK: NEGATIVE
Ketones, ur: NEGATIVE mg/dL
Leukocytes, UA: NEGATIVE
Nitrite: NEGATIVE
PH: 6.5 (ref 5.0–8.0)
Protein, ur: NEGATIVE mg/dL
SPECIFIC GRAVITY, URINE: 1.013 (ref 1.005–1.030)
UROBILINOGEN UA: 1 mg/dL (ref 0.0–1.0)

## 2014-07-09 LAB — SAMPLE TO BLOOD BANK

## 2014-07-09 LAB — POC OCCULT BLOOD, ED: Fecal Occult Bld: NEGATIVE

## 2014-07-09 LAB — APTT: aPTT: 33 seconds (ref 24–37)

## 2014-07-09 MED ORDER — IOHEXOL 300 MG/ML  SOLN
100.0000 mL | Freq: Once | INTRAMUSCULAR | Status: AC | PRN
  Administered 2014-07-09: 100 mL via INTRAVENOUS

## 2014-07-09 MED ORDER — SODIUM CHLORIDE 0.9 % IV SOLN
INTRAVENOUS | Status: DC
  Administered 2014-07-09: 14:00:00 via INTRAVENOUS

## 2014-07-09 NOTE — Telephone Encounter (Signed)
Caregiver called and left message on voicemail and stated that patient was dizzy and disoriented. I returned her call and she stated that they have taken her to the hospital but she had called the wrong Dr. Gabriel Carina this morning and apologized. She was suppose to call Dr. Levin Erp.

## 2014-07-09 NOTE — ED Notes (Signed)
Pt provided for need of urine sample.

## 2014-07-09 NOTE — ED Notes (Signed)
Per EMS: pt from home where she lives alone, family called EMS to report pt "not acting like herself". Per FAmily, pt normally more active and talkative, pt states for a few weeks she has been feeling more weak. Denies any n/v/d/ but does reports she has just not been hungry or thirsty so has not been eating or drinking like she normally does. Per EMS, pt is alert and oriented, just confused about the time. Pt denies any pain at this time, nad noted. Family at bedside.

## 2014-07-09 NOTE — ED Provider Notes (Addendum)
CSN: [id number]     Arrival date & time 07/09/14  1200 History   First MD Initiated Contact with Patient 07/09/14 1202     Chief Complaint  Patient presents with  . Weakness     (Consider location/radiation/quality/duration/timing/severity/associated sxs/prior Treatment) HPI Patient presents to the emergency department with her great-granddaughter. She states she's been feeling weak for the past week. She states the weakness gets worse when she exerts her self on her walker. She denies cough, sore throat, headache, chest pain, but has had rhinorrhea and sneezing with questionable yellow mucus. She also has had some nausea off and on but no vomiting or diarrhea. She states she feels like she is constipated although she did have a BM yesterday that was hard. She also describes some burning when she urinates and she also is having increased nocturia. She also reports to the family her stools have been black. She takes MiraLAX once a day for constipation. She states she's never felt this way before.  PCP Dr Grayland Ormond  Past Medical History  Diagnosis Date  . Hypertension   . Dyslipidemia   . Hemorrhoid    Past Surgical History  Procedure Laterality Date  . Cholecystectomy    . Flexible sigmoidoscopy  11/24/2012    Procedure: FLEXIBLE SIGMOIDOSCOPY;  Surgeon: Missy Sabins, MD;  Location: [place];  Service: Endoscopy;  Laterality: N/A;   Family History  Problem Relation Age of Onset  . Breast cancer Daughter    History  Substance Use Topics  . Smoking status: Never Smoker   . Smokeless tobacco: Not on file  . Alcohol Use: No   Lives at home Lives alone Uses a walker   OB History   Grav Para Term Preterm Abortions TAB SAB Ect Mult Living                 Review of Systems  All other systems reviewed and are negative.     Allergies  Review of patient's allergies indicates no known allergies.  Home Medications   Prior to Admission medications   Medication Sig Start  Date End Date Taking? Authorizing Provider  amLODipine (NORVASC) 5 MG tablet Take 5 mg by mouth daily.   Yes Historical Provider, MD  aspirin EC 81 MG tablet Take 81 mg by mouth daily.   Yes Historical Provider, MD  atorvastatin (LIPITOR) 20 MG tablet Take 20 mg by mouth daily.   Yes Historical Provider, MD  clorazepate (TRANXENE) 3.75 MG tablet Take 3.75 mg by mouth 3 (three) times daily.   Yes Historical Provider, MD  Multiple Vitamins-Minerals (ICAPS PO) Take 1 capsule by mouth daily.   Yes Historical Provider, MD  omeprazole (PRILOSEC) 20 MG capsule Take 20 mg by mouth daily.   Yes Historical Provider, MD  valsartan-hydrochlorothiazide (DIOVAN-HCT) 320-25 MG per tablet Take 1 tablet by mouth daily.   Yes Historical Provider, MD   BP 121/46  Pulse 57  Temp(Src) 97.6 F (36.4 C) (Oral)  Resp 18  SpO2 95%  Vital signs normal except for bradycardia  Physical Exam  Nursing note and vitals reviewed. Constitutional: She is oriented to person, place, and time. She appears well-developed and well-nourished.  Non-toxic appearance. She does not appear ill. No distress.  HENT:  Head: Normocephalic and atraumatic.  Right Ear: External ear normal.  Left Ear: External ear normal.  Nose: Nose normal. No mucosal edema or rhinorrhea.  Mouth/Throat: Oropharynx is clear and moist and mucous membranes are normal. No dental abscesses or  uvula swelling.  Eyes: Conjunctivae and EOM are normal. Pupils are equal, round, and reactive to light.  Neck: Normal range of motion and full passive range of motion without pain. Neck supple.  Cardiovascular: Normal rate, regular rhythm and normal heart sounds.  Exam reveals no gallop and no friction rub.   No murmur heard. Pulmonary/Chest: Effort normal and breath sounds normal. No respiratory distress. She has no wheezes. She has no rhonchi. She has no rales. She exhibits no tenderness and no crepitus.  Abdominal: Soft. Normal appearance and bowel sounds are normal.  She exhibits no distension. There is tenderness. There is no rebound and no guarding.    Mild lower abdominal tenderness to palpation  Musculoskeletal: Normal range of motion. She exhibits no edema and no tenderness.  Moves all extremities well.   Neurological: She is alert and oriented to person, place, and time. She has normal strength. No cranial nerve deficit.  Skin: Skin is warm, dry and intact. No rash noted. No erythema. There is pallor.  Psychiatric: She has a normal mood and affect. Her speech is normal and behavior is normal. Her mood appears not anxious.    ED Course  Procedures (including critical care time)  Medications  0.9 %  sodium chloride infusion ( Intravenous New Bag/Given 07/09/14 1334)  iohexol (OMNIPAQUE) 300 MG/ML solution 100 mL (100 mLs Intravenous Contrast Given 07/09/14 1605)    Discussed her test results, agreeable to get CT scans done. Family (multiple generations) want patient to be admitted. We have discussed several times that she would have to have a medical reason to be admitted.   18:30 Pt and family given results of her CT scan. Her heart rate dropped to 44 during my interview. Family again expressing they want her to be admitted. Despite having 4 generations of family visiting in the room all day today they state she lives alone and they don't think she can go home by herself.   20:07 Dr Ernestina Patches, will come see patient and decide if he is going to admit.   20:40 Dr Ernestina Patches has seen the patient and talked to the family. Patient wants to go home, family states they will watch her. They are going to decide what to do about the lung mass, advised to call Dr Jarrett Albor [place] office tomorrow to get further evaluation coordinated if they want to pursue it.   Labs Review Results for orders placed during the hospital encounter of 07/09/14  CBC WITH DIFFERENTIAL      Result Value Ref Range   WBC 7.5  4.0 - 10.5 K/uL   RBC 4.14  3.87 - 5.11 MIL/uL   Hemoglobin 11.0 (*)  12.0 - 15.0 g/dL   HCT 35.1 (*) 36.0 - 46.0 %   MCV 84.8  78.0 - 100.0 fL   MCH 26.6  26.0 - 34.0 pg   MCHC 31.3  30.0 - 36.0 g/dL   RDW 16.4 (*) 11.5 - 15.5 %   Platelets 440 (*) 150 - 400 K/uL   Neutrophils Relative % 64  43 - 77 %   Neutro Abs 4.8  1.7 - 7.7 K/uL   Lymphocytes Relative 20  12 - 46 %   Lymphs Abs 1.5  0.7 - 4.0 K/uL   Monocytes Relative 10  3 - 12 %   Monocytes Absolute 0.7  0.1 - 1.0 K/uL   Eosinophils Relative 6 (*) 0 - 5 %   Eosinophils Absolute 0.4  0.0 - 0.7 K/uL   Basophils  Relative 0  0 - 1 %   Basophils Absolute 0.0  0.0 - 0.1 K/uL  COMPREHENSIVE METABOLIC PANEL      Result Value Ref Range   Sodium 137  137 - 147 mEq/L   Potassium 4.6  3.7 - 5.3 mEq/L   Chloride 96  96 - 112 mEq/L   CO2 28  19 - 32 mEq/L   Glucose, Bld 184 (*) 70 - 99 mg/dL   BUN 17  6 - 23 mg/dL   Creatinine, Ser 0.99  0.50 - 1.10 mg/dL   Calcium 8.9  8.4 - 10.5 mg/dL   Total Protein 6.5  6.0 - 8.3 g/dL   Albumin 2.7 (*) 3.5 - 5.2 g/dL   AST 18  0 - 37 U/L   ALT 12  0 - 35 U/L   Alkaline Phosphatase 89  39 - 117 U/L   Total Bilirubin 0.3  0.3 - 1.2 mg/dL   GFR calc non Af Amer 47 (*) >90 mL/min   GFR calc Af Amer 55 (*) >90 mL/min   Anion gap 13  5 - 15  APTT      Result Value Ref Range   aPTT 33  24 - 37 seconds  PROTIME-INR      Result Value Ref Range   Prothrombin Time 14.7  11.6 - 15.2 seconds   INR 1.15  0.00 - 1.49  URINALYSIS, ROUTINE W REFLEX MICROSCOPIC      Result Value Ref Range   Color, Urine YELLOW  YELLOW   APPearance CLEAR  CLEAR   Specific Gravity, Urine 1.013  1.005 - 1.030   pH 6.5  5.0 - 8.0   Glucose, UA NEGATIVE  NEGATIVE mg/dL   Hgb urine dipstick NEGATIVE  NEGATIVE   Bilirubin Urine NEGATIVE  NEGATIVE   Ketones, ur NEGATIVE  NEGATIVE mg/dL   Protein, ur NEGATIVE  NEGATIVE mg/dL   Urobilinogen, UA 1.0  0.0 - 1.0 mg/dL   Nitrite NEGATIVE  NEGATIVE   Leukocytes, UA NEGATIVE  NEGATIVE  POC OCCULT BLOOD, ED      Result Value Ref Range   Fecal  Occult Bld NEGATIVE  NEGATIVE  SAMPLE TO BLOOD BANK      Result Value Ref Range   Blood Bank Specimen SAMPLE AVAILABLE FOR TESTING     Sample Expiration 07/10/2014     Laboratory interpretation all normal except mild anemia, hyperglycemia    Imaging Review Ct Head W Wo Contrast  07/09/2014   CLINICAL DATA:  Weakness with known chest tumor. Evaluate for malignancy.  EXAM: CT HEAD WITHOUT AND WITH CONTRAST  TECHNIQUE: Contiguous axial images were obtained from the base of the skull through the vertex without and with intravenous contrast  CONTRAST:  100 mL Omnipaque 300 IV.  COMPARISON:  CT of the chest is reported separately.  FINDINGS: No evidence for acute infarction, hemorrhage, mass lesion, hydrocephalus, or extra-axial fluid. Moderate cerebral and cerebellar atrophy, not unexpected for age. 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 Hyperostosis frontalis interna. Advanced vascular calcification. No acute sinus or mastoid disease. BILATERAL cataract extraction.  Post infusion, no abnormal enhancement of the brain or meninges. A small focus of apparent LEFT frontal cortex enhancement as seen on image 20 series 2 represents partial volume averaging with the adjacent hyperostosis, not a cortical metastasis.  IMPRESSION: Atrophy and small vessel disease. No acute intracranial findings. No visible metastatic disease.  If no  contraindications, and further confirmation is desired, MRI is more sensitive in the detection of intracranial disease.   Electronically Signed   By: Rolla Flatten M.D.   On: 07/09/2014 16:58   Ct Chest W Contrast  Ct Abdomen Pelvis W Contrast  07/09/2014   CLINICAL DATA:  Weakness.  Lung mass.  EXAM: CT CHEST, ABDOMEN, AND PELVIS WITH CONTRAST  TECHNIQUE: Multidetector CT imaging of the chest, abdomen and pelvis was performed following the standard protocol  during bolus administration of intravenous contrast.  CONTRAST:  164mL OMNIPAQUE IOHEXOL 300 MG/ML  SOLN  COMPARISON:  Three-way abdominal series earlier today  FINDINGS: CT CHEST FINDINGS  There are no enlarged axillary or mediastinal lymph nodes. There is a 4.3 x 2.6 x 3.5 cm mass in the left upper lobe at the superior aspect of the left hilum and abutting the mediastinum at the level of the proximal descending thoracic aorta. The mass also abuts the superior aspect of the left lower lobe pulmonary artery. No enlarged lymph nodes are seen elsewhere in the left hilum or in the right hilum. Mild to moderate LAD coronary artery calcifications are noted. Heart is normal in size. Moderate to severe thoracic aortic atherosclerotic calcification is present.  There is no pleural or pericardial effusion. There is moderate centrilobular emphysema. Minimal subpleural reticular opacities are present in the lung bases and may reflect minimal atelectasis or possibly early fibrotic change. Degenerative changes are partially visualized at the right glenohumeral joint.  CT ABDOMEN AND PELVIS FINDINGS  The gallbladder is surgically absent. The liver, spleen, adrenal glands, and pancreas have an unremarkable enhanced appearance. Right renal cysts measure 10 mm and 2.8 cm in the interpolar region. Smaller hypodensities in both kidneys are too small to fully characterize but most likely represent cysts. A lesion in the anterior interpolar left kidney measures 3.1 x 2.6 cm and demonstrates near water attenuation posteriorly but slightly higher attenuation anteromedially.  There is a small sliding hiatal hernia. There is descending and sigmoid colonic diverticulosis with mild diffuse wall thickening but no definite adjacent inflammatory changes to clearly indicate acute diverticulitis. There is no evidence of bowel obstruction. Severe atherosclerotic calcification is noted of the abdominal aorta and its major branch vessels. There are  multiple subcentimeter periaortic lymph nodes measuring up to 9 mm in short axis. There is moderate distention of the bladder. Uterus and ovaries are identified. No pelvic mass is seen. Advanced thoracolumbar spondylosis is noted. No lytic or blastic osseous lesions are identified.  IMPRESSION: 1. 4.3 cm out left upper lobe suprahilar mass, concerning for primary bronchogenic carcinoma. No evidence of lymphadenopathy/metastatic disease in the chest. 2. Centrilobular emphysema. 3. No clear evidence of metastatic disease in the abdomen or pelvis. Subcentimeter retroperitoneal lymph nodes are indeterminate. 4. 3.1 cm complex cystic lesion in the left kidney, indeterminate. Consider further evaluation with nonemergent abdominal MRI.   Electronically Signed   By: Logan Bores   On: 07/09/2014 17:11   Dg Abd Acute W/chest  07/09/2014   CLINICAL DATA:  Lower abdominal pain.  EXAM: ACUTE ABDOMEN SERIES (ABDOMEN 2 VIEW & CHEST 1 VIEW)  COMPARISON:  Chest x-ray dated 09/22/2010 and abdomen radiograph dated 11/23/2012  FINDINGS: There is a new mass in the superior aspect of the left hilum. The lungs are otherwise clear. Heart size and vascularity are normal. Tortuosity and calcification of thoracic aorta.  There is no free air or free fluid in the abdomen. The bowel gas pattern is normal. Degenerative changes in the  spine are noted and previous lumbar decompression at L4. Evidence of previous cholecystectomy.  IMPRESSION: 1. New mass in the superior aspect of the left hilum worrisome for carcinoma of the lung. 2. Benign appearing abdomen.   Electronically Signed   By: Rozetta Nunnery M.D.   On: 07/09/2014 13:21     EKG Interpretation   Date/Time:  Wednesday July 09 2014 12:10:20 EDT Ventricular Rate:  58 PR Interval:  231 QRS Duration: 88 QT Interval:  409 QTC Calculation: 402 R Axis:   -30 Text Interpretation:  Sinus rhythm Prolonged PR interval Left axis  deviation Low voltage, precordial leads Probable  anteroseptal infarct, old  No significant change since last tracing 23 Sep 2010 Confirmed by [facility]   MD-I, Leverne Tessler ([id number]) on 07/09/2014 12:14:52 PM      MDM   Final diagnoses:  Weakness  Bradycardia  Lung mass    Disposition pending Dr Romona Curls evaluation  Plan discharge   Rolland Porter, MD, Alanson Aly, MD 07/09/14 2016  Janice Norrie, MD 07/09/14 2045

## 2014-07-09 NOTE — Discharge Instructions (Signed)
Return to the ED if you feel worse again. You need to call Dr Deshanti Adcox [place] office tomorrow to have him coordinate evaluating the new tumor in your lung.

## 2014-07-09 NOTE — Consult Note (Signed)
Hospitalist Consult Note   Patient name: [name] Medical record number: [id number] Date of birth: 31-Mar-1918 Age: 80 y.o. Gender: female  Primary Care Provider: Criselda Peaches, MD  Chief Complaint: weakness, confusion  History of Present Illness:This is a 80 y.o. year old female with significant past medical history of HTN, HLD, hx/o LGI bleed 2013 presenting with weakness and confusion. The family reports the patient is in overall functional decline over the past year or so. Was previously living at home alone. However, increasing care. Has also had progressive lower extremity weakness. Has not seen bus physical therapy. 9) walker. Per the family, patient was noted to be mildly confused but more weakness earlier today. Family denies any recent infections. No direct falls. No reports of vomiting, diarrhea, dysuria. Pt takes clorazapate for anxiety, believes she may have been taking extra doses.  Presented to ER today because of sxs. Afebrile on presentation. Hemodynamically stable. HR has varied intermittently from 40s-60s. Pt not on beta blocker. WBC 7.5, hgb 11, Cr 0.99, Glu 184. Headt CT negative for any acute abnormalities. CT Chest and abdomen shows 4.3 cm L upper lobe mass concerning for bronchogenic carcinoma, emphysema, no evidence of metastatic disease. Also 3 cm complex cyst on L kidney. UA WNL. Pt given NS bolus in ER. Feels symptomatically much improved.   Assessment and Plan: [name] is a 80 y.o. year old female presenting with weakness, confusion   Weakness/Confusion: Broad differential though suspect this is multifactorial with contributions of dehydration, cancer,? Medication,? Intermittent bradycardia. Had a relatively lengthy discussion with both patient and family. Patient is adamant that she wants to go home-is refusing admission. Both family and myself feel the patient is competent to make this decision including her healthcare power of attorney. Family's primary  concern was for home health needs as weakness has been a progressive issue. Family does not feel inclined to do any aggressive measures for noted bronchogenic carcinoma. Discussed with family that as patient is adamant about going home, she can follow up with her primary care provider first in the morning with plans for home health set up which family is agreeable to. Discussed the overall risks of non-admission. The patient's family expressed understanding. Plan to hold medications overnight. Patient does feel symptomatically improved per her report. Discussed patient's wishes with the ER physician Dr. Tomi Bamberger.   Patient Active Problem List   Diagnosis Date Noted  . LGI bleed 11/23/2012  . Hypertension   . Hemorrhoid   . Dyslipidemia    Past Medical History: Past Medical History  Diagnosis Date  . Hypertension   . Dyslipidemia   . Hemorrhoid     Past Surgical History: Past Surgical History  Procedure Laterality Date  . Cholecystectomy    . Flexible sigmoidoscopy  11/24/2012    Procedure: FLEXIBLE SIGMOIDOSCOPY;  Surgeon: Missy Sabins, MD;  Location: [facility];  Service: Endoscopy;  Laterality: N/A;    Social History: History   Social History  . Marital Status: Widowed    Spouse Name: N/A    Number of Children: N/A  . Years of Education: N/A   Social History Main Topics  . Smoking status: Never Smoker   . Smokeless tobacco: None  . Alcohol Use: No  . Drug Use: No  . Sexual Activity:    Other Topics Concern  . None   Social History Narrative  . None    Family History: Family History  Problem Relation Age of Onset  . Breast cancer  Daughter     Allergies: No Known Allergies  Current Facility-Administered Medications  Medication Dose Route Frequency Provider Last Rate Last Dose  . 0.9 %  sodium chloride infusion   Intravenous Continuous Janice Norrie, MD 100 mL/hr at 07/09/14 1334     Current Outpatient Prescriptions  Medication Sig Dispense Refill  .  amLODipine (NORVASC) 5 MG tablet Take 5 mg by mouth daily.      Marland Kitchen aspirin EC 81 MG tablet Take 81 mg by mouth daily.      Marland Kitchen atorvastatin (LIPITOR) 20 MG tablet Take 20 mg by mouth daily.      . clorazepate (TRANXENE) 3.75 MG tablet Take 3.75 mg by mouth 3 (three) times daily.      . Multiple Vitamins-Minerals (ICAPS PO) Take 1 capsule by mouth daily.      Marland Kitchen omeprazole (PRILOSEC) 20 MG capsule Take 20 mg by mouth daily.      . valsartan-hydrochlorothiazide (DIOVAN-HCT) 320-25 MG per tablet Take 1 tablet by mouth daily.       Review Of Systems: 12 point ROS negative except as noted above in HPI.  Physical Exam: Filed Vitals:   07/09/14 2000  BP: 143/69  Pulse:   Temp:   Resp: 22    General: alert, cooperative and oriented x 2-3  HEENT: PERRLA and extra ocular movement intact Heart: S1, S2 normal, no murmur, rub or gallop, regular rate and rhythm Lungs: clear to auscultation, no wheezes or rales and unlabored breathing Abdomen: abdomen is soft without significant tenderness, masses, organomegaly or guarding Extremities: extremities normal, atraumatic, no cyanosis or edema Skin:no rashes, no ecchymoses Neurology: normal without focal findings  Labs and Imaging: Lab Results  Component Value Date/Time   [name] 137 07/09/2014 12:32 PM   K 4.6 07/09/2014 12:32 PM   CL 96 07/09/2014 12:32 PM   CO2 28 07/09/2014 12:32 PM   BUN 17 07/09/2014 12:32 PM   CREATININE 0.99 07/09/2014 12:32 PM   GLUCOSE 184* 07/09/2014 12:32 PM   Lab Results  Component Value Date   WBC 7.5 07/09/2014   HGB 11.0* 07/09/2014   HCT 35.1* 07/09/2014   MCV 84.8 07/09/2014   PLT 440* 07/09/2014    Ct Head W Wo Contrast  07/09/2014   CLINICAL DATA:  Weakness with known chest tumor. Evaluate for malignancy.  EXAM: CT HEAD WITHOUT AND WITH CONTRAST  TECHNIQUE: Contiguous axial images were obtained from the base of the skull through the vertex without and with intravenous contrast  CONTRAST:  100 mL Omnipaque 300 IV.   COMPARISON:  CT of the chest is reported separately.  FINDINGS: No evidence for acute infarction, hemorrhage, mass lesion, hydrocephalus, or extra-axial fluid. Moderate cerebral and cerebellar atrophy, not unexpected for age. 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 Hyperostosis frontalis interna. Advanced vascular calcification. No acute sinus or mastoid disease. BILATERAL cataract extraction.  Post infusion, no abnormal enhancement of the brain or meninges. A small focus of apparent LEFT frontal cortex enhancement as seen on image 20 series 2 represents partial volume averaging with the adjacent hyperostosis, not a cortical metastasis.  IMPRESSION: Atrophy and small vessel disease. No acute intracranial findings. No visible metastatic disease.  If no contraindications, and further confirmation is desired, MRI is more sensitive in the detection of intracranial disease.   Electronically Signed   By: Rolla Flatten M.D.   On: 07/09/2014 16:58   Ct Chest W  Contrast  07/09/2014   CLINICAL DATA:  Weakness.  Lung mass.  EXAM: CT CHEST, ABDOMEN, AND PELVIS WITH CONTRAST  TECHNIQUE: Multidetector CT imaging of the chest, abdomen and pelvis was performed following the standard protocol during bolus administration of intravenous contrast.  CONTRAST:  160mL OMNIPAQUE IOHEXOL 300 MG/ML  SOLN  COMPARISON:  Three-way abdominal series earlier today  FINDINGS: CT CHEST FINDINGS  There are no enlarged axillary or mediastinal lymph nodes. There is a 4.3 x 2.6 x 3.5 cm mass in the left upper lobe at the superior aspect of the left hilum and abutting the mediastinum at the level of the proximal descending thoracic aorta. The mass also abuts the superior aspect of the left lower lobe pulmonary artery. No enlarged lymph nodes are seen elsewhere in the left hilum or in the right hilum. Mild to moderate LAD coronary  artery calcifications are noted. Heart is normal in size. Moderate to severe thoracic aortic atherosclerotic calcification is present.  There is no pleural or pericardial effusion. There is moderate centrilobular emphysema. Minimal subpleural reticular opacities are present in the lung bases and may reflect minimal atelectasis or possibly early fibrotic change. Degenerative changes are partially visualized at the right glenohumeral joint.  CT ABDOMEN AND PELVIS FINDINGS  The gallbladder is surgically absent. The liver, spleen, adrenal glands, and pancreas have an unremarkable enhanced appearance. Right renal cysts measure 10 mm and 2.8 cm in the interpolar region. Smaller hypodensities in both kidneys are too small to fully characterize but most likely represent cysts. A lesion in the anterior interpolar left kidney measures 3.1 x 2.6 cm and demonstrates near water attenuation posteriorly but slightly higher attenuation anteromedially.  There is a small sliding hiatal hernia. There is descending and sigmoid colonic diverticulosis with mild diffuse wall thickening but no definite adjacent inflammatory changes to clearly indicate acute diverticulitis. There is no evidence of bowel obstruction. Severe atherosclerotic calcification is noted of the abdominal aorta and its major branch vessels. There are multiple subcentimeter periaortic lymph nodes measuring up to 9 mm in short axis. There is moderate distention of the bladder. Uterus and ovaries are identified. No pelvic mass is seen. Advanced thoracolumbar spondylosis is noted. No lytic or blastic osseous lesions are identified.  IMPRESSION: 1. 4.3 cm out left upper lobe suprahilar mass, concerning for primary bronchogenic carcinoma. No evidence of lymphadenopathy/metastatic disease in the chest. 2. Centrilobular emphysema. 3. No clear evidence of metastatic disease in the abdomen or pelvis. Subcentimeter retroperitoneal lymph nodes are indeterminate. 4. 3.1 cm complex  cystic lesion in the left kidney, indeterminate. Consider further evaluation with nonemergent abdominal MRI.   Electronically Signed   By: Logan Bores   On: 07/09/2014 17:11   Ct Abdomen Pelvis W Contrast  07/09/2014   CLINICAL DATA:  Weakness.  Lung mass.  EXAM: CT CHEST, ABDOMEN, AND PELVIS WITH CONTRAST  TECHNIQUE: Multidetector CT imaging of the chest, abdomen and pelvis was performed following the standard protocol during bolus administration of intravenous contrast.  CONTRAST:  185mL OMNIPAQUE IOHEXOL 300 MG/ML  SOLN  COMPARISON:  Three-way abdominal series earlier today  FINDINGS: CT CHEST FINDINGS  There are no enlarged axillary or mediastinal lymph nodes. There is a 4.3 x 2.6 x 3.5 cm mass in the left upper lobe at the superior aspect of the left hilum and abutting the mediastinum at the level of the proximal descending thoracic aorta. The mass also abuts the superior aspect of the left lower lobe pulmonary artery. No enlarged lymph nodes  are seen elsewhere in the left hilum or in the right hilum. Mild to moderate LAD coronary artery calcifications are noted. Heart is normal in size. Moderate to severe thoracic aortic atherosclerotic calcification is present.  There is no pleural or pericardial effusion. There is moderate centrilobular emphysema. Minimal subpleural reticular opacities are present in the lung bases and may reflect minimal atelectasis or possibly early fibrotic change. Degenerative changes are partially visualized at the right glenohumeral joint.  CT ABDOMEN AND PELVIS FINDINGS  The gallbladder is surgically absent. The liver, spleen, adrenal glands, and pancreas have an unremarkable enhanced appearance. Right renal cysts measure 10 mm and 2.8 cm in the interpolar region. Smaller hypodensities in both kidneys are too small to fully characterize but most likely represent cysts. A lesion in the anterior interpolar left kidney measures 3.1 x 2.6 cm and demonstrates near water attenuation  posteriorly but slightly higher attenuation anteromedially.  There is a small sliding hiatal hernia. There is descending and sigmoid colonic diverticulosis with mild diffuse wall thickening but no definite adjacent inflammatory changes to clearly indicate acute diverticulitis. There is no evidence of bowel obstruction. Severe atherosclerotic calcification is noted of the abdominal aorta and its major branch vessels. There are multiple subcentimeter periaortic lymph nodes measuring up to 9 mm in short axis. There is moderate distention of the bladder. Uterus and ovaries are identified. No pelvic mass is seen. Advanced thoracolumbar spondylosis is noted. No lytic or blastic osseous lesions are identified.  IMPRESSION: 1. 4.3 cm out left upper lobe suprahilar mass, concerning for primary bronchogenic carcinoma. No evidence of lymphadenopathy/metastatic disease in the chest. 2. Centrilobular emphysema. 3. No clear evidence of metastatic disease in the abdomen or pelvis. Subcentimeter retroperitoneal lymph nodes are indeterminate. 4. 3.1 cm complex cystic lesion in the left kidney, indeterminate. Consider further evaluation with nonemergent abdominal MRI.   Electronically Signed   By: Logan Bores   On: 07/09/2014 17:11   Dg Abd Acute W/chest  07/09/2014   CLINICAL DATA:  Lower abdominal pain.  EXAM: ACUTE ABDOMEN SERIES (ABDOMEN 2 VIEW & CHEST 1 VIEW)  COMPARISON:  Chest x-ray dated 09/22/2010 and abdomen radiograph dated 11/23/2012  FINDINGS: There is a new mass in the superior aspect of the left hilum. The lungs are otherwise clear. Heart size and vascularity are normal. Tortuosity and calcification of thoracic aorta.  There is no free air or free fluid in the abdomen. The bowel gas pattern is normal. Degenerative changes in the spine are noted and previous lumbar decompression at L4. Evidence of previous cholecystectomy.  IMPRESSION: 1. New mass in the superior aspect of the left hilum worrisome for carcinoma of  the lung. 2. Benign appearing abdomen.   Electronically Signed   By: Rozetta Nunnery M.D.   On: 07/09/2014 13:21           Shanda Howells MD  Pager: [phone]

## 2014-07-09 NOTE — ED Notes (Signed)
Family states that pt may be taking to much of her clorazepete, states that her pills were "not adding up to what pt should be taking, states she has more pills gone than she should since 7/9." Dr. Tomi Bamberger updated.

## 2015-03-27 DIAGNOSIS — M2352 Chronic instability of knee, left knee: Secondary | ICD-10-CM | POA: Diagnosis not present

## 2015-04-20 DIAGNOSIS — D692 Other nonthrombocytopenic purpura: Secondary | ICD-10-CM | POA: Diagnosis not present

## 2015-04-20 DIAGNOSIS — L708 Other acne: Secondary | ICD-10-CM | POA: Diagnosis not present

## 2015-07-01 DIAGNOSIS — K5900 Constipation, unspecified: Secondary | ICD-10-CM | POA: Diagnosis not present

## 2015-07-01 DIAGNOSIS — I10 Essential (primary) hypertension: Secondary | ICD-10-CM | POA: Diagnosis not present

## 2015-07-01 DIAGNOSIS — M1990 Unspecified osteoarthritis, unspecified site: Secondary | ICD-10-CM | POA: Diagnosis not present

## 2015-07-01 DIAGNOSIS — G8929 Other chronic pain: Secondary | ICD-10-CM | POA: Diagnosis not present

## 2015-07-01 DIAGNOSIS — K219 Gastro-esophageal reflux disease without esophagitis: Secondary | ICD-10-CM | POA: Diagnosis not present

## 2015-07-08 DIAGNOSIS — K219 Gastro-esophageal reflux disease without esophagitis: Secondary | ICD-10-CM | POA: Diagnosis not present

## 2015-07-08 DIAGNOSIS — M1990 Unspecified osteoarthritis, unspecified site: Secondary | ICD-10-CM | POA: Diagnosis not present

## 2015-07-08 DIAGNOSIS — I10 Essential (primary) hypertension: Secondary | ICD-10-CM | POA: Diagnosis not present

## 2015-07-08 DIAGNOSIS — K5900 Constipation, unspecified: Secondary | ICD-10-CM | POA: Diagnosis not present

## 2015-07-08 DIAGNOSIS — G8929 Other chronic pain: Secondary | ICD-10-CM | POA: Diagnosis not present

## 2015-09-02 DIAGNOSIS — M25561 Pain in right knee: Secondary | ICD-10-CM | POA: Diagnosis not present

## 2015-09-02 DIAGNOSIS — E785 Hyperlipidemia, unspecified: Secondary | ICD-10-CM | POA: Diagnosis not present

## 2015-09-02 DIAGNOSIS — M25562 Pain in left knee: Secondary | ICD-10-CM | POA: Diagnosis not present

## 2015-09-02 DIAGNOSIS — E870 Hyperosmolality and hypernatremia: Secondary | ICD-10-CM | POA: Diagnosis not present

## 2015-09-02 DIAGNOSIS — I10 Essential (primary) hypertension: Secondary | ICD-10-CM | POA: Diagnosis not present

## 2015-09-02 DIAGNOSIS — E119 Type 2 diabetes mellitus without complications: Secondary | ICD-10-CM | POA: Diagnosis not present

## 2015-09-09 DIAGNOSIS — E119 Type 2 diabetes mellitus without complications: Secondary | ICD-10-CM | POA: Diagnosis not present

## 2015-09-09 DIAGNOSIS — Z79899 Other long term (current) drug therapy: Secondary | ICD-10-CM | POA: Diagnosis not present

## 2015-09-09 DIAGNOSIS — K5900 Constipation, unspecified: Secondary | ICD-10-CM | POA: Diagnosis not present

## 2015-09-09 DIAGNOSIS — K649 Unspecified hemorrhoids: Secondary | ICD-10-CM | POA: Diagnosis not present

## 2015-09-09 DIAGNOSIS — I10 Essential (primary) hypertension: Secondary | ICD-10-CM | POA: Diagnosis not present

## 2015-09-13 ENCOUNTER — Encounter (HOSPITAL_COMMUNITY): Payer: Self-pay | Admitting: *Deleted

## 2015-09-13 ENCOUNTER — Emergency Department (HOSPITAL_COMMUNITY): Payer: Medicare Other

## 2015-09-13 ENCOUNTER — Emergency Department (HOSPITAL_COMMUNITY)
Admission: EM | Admit: 2015-09-13 | Discharge: 2015-09-13 | Disposition: A | Discharge status: Home / Self Care | Payer: Medicare Other | Attending: Emergency Medicine | Admitting: Emergency Medicine

## 2015-09-13 DIAGNOSIS — M25569 Pain in unspecified knee: Secondary | ICD-10-CM | POA: Diagnosis not present

## 2015-09-13 DIAGNOSIS — W19XXXA Unspecified fall, initial encounter: Secondary | ICD-10-CM

## 2015-09-13 DIAGNOSIS — Y92129 Unspecified place in nursing home as the place of occurrence of the external cause: Secondary | ICD-10-CM | POA: Diagnosis not present

## 2015-09-13 DIAGNOSIS — R079 Chest pain, unspecified: Secondary | ICD-10-CM | POA: Diagnosis not present

## 2015-09-13 DIAGNOSIS — T148 Other injury of unspecified body region: Secondary | ICD-10-CM | POA: Diagnosis not present

## 2015-09-13 DIAGNOSIS — C3412 Malignant neoplasm of upper lobe, left bronchus or lung: Secondary | ICD-10-CM | POA: Diagnosis not present

## 2015-09-13 DIAGNOSIS — G8929 Other chronic pain: Secondary | ICD-10-CM | POA: Insufficient documentation

## 2015-09-13 DIAGNOSIS — W1839XA Other fall on same level, initial encounter: Secondary | ICD-10-CM | POA: Insufficient documentation

## 2015-09-13 DIAGNOSIS — Y939 Activity, unspecified: Secondary | ICD-10-CM | POA: Insufficient documentation

## 2015-09-13 DIAGNOSIS — Z7952 Long term (current) use of systemic steroids: Secondary | ICD-10-CM | POA: Insufficient documentation

## 2015-09-13 DIAGNOSIS — Y999 Unspecified external cause status: Secondary | ICD-10-CM | POA: Diagnosis not present

## 2015-09-13 DIAGNOSIS — S0990XA Unspecified injury of head, initial encounter: Secondary | ICD-10-CM | POA: Insufficient documentation

## 2015-09-13 DIAGNOSIS — Z79899 Other long term (current) drug therapy: Secondary | ICD-10-CM | POA: Diagnosis not present

## 2015-09-13 DIAGNOSIS — S8992XA Unspecified injury of left lower leg, initial encounter: Secondary | ICD-10-CM | POA: Insufficient documentation

## 2015-09-13 DIAGNOSIS — M546 Pain in thoracic spine: Secondary | ICD-10-CM | POA: Diagnosis not present

## 2015-09-13 DIAGNOSIS — I10 Essential (primary) hypertension: Secondary | ICD-10-CM | POA: Insufficient documentation

## 2015-09-13 DIAGNOSIS — Z7982 Long term (current) use of aspirin: Secondary | ICD-10-CM | POA: Insufficient documentation

## 2015-09-13 DIAGNOSIS — Z791 Long term (current) use of non-steroidal anti-inflammatories (NSAID): Secondary | ICD-10-CM | POA: Diagnosis not present

## 2015-09-13 DIAGNOSIS — S8991XA Unspecified injury of right lower leg, initial encounter: Secondary | ICD-10-CM | POA: Diagnosis not present

## 2015-09-13 DIAGNOSIS — R51 Headache: Secondary | ICD-10-CM | POA: Diagnosis not present

## 2015-09-13 DIAGNOSIS — S299XXA Unspecified injury of thorax, initial encounter: Secondary | ICD-10-CM | POA: Insufficient documentation

## 2015-09-13 DIAGNOSIS — M542 Cervicalgia: Secondary | ICD-10-CM | POA: Diagnosis not present

## 2015-09-13 DIAGNOSIS — E785 Hyperlipidemia, unspecified: Secondary | ICD-10-CM | POA: Insufficient documentation

## 2015-09-13 HISTORY — DX: Other chronic pain: G89.29

## 2015-09-13 LAB — BASIC METABOLIC PANEL
ANION GAP: 12 (ref 5–15)
BUN: 35 mg/dL — ABNORMAL HIGH (ref 6–20)
CO2: 27 mmol/L (ref 22–32)
Calcium: 9.3 mg/dL (ref 8.9–10.3)
Chloride: 97 mmol/L — ABNORMAL LOW (ref 101–111)
Creatinine, Ser: 1.06 mg/dL — ABNORMAL HIGH (ref 0.44–1.00)
GFR, EST AFRICAN AMERICAN: 50 mL/min — AB (ref >60)
GFR, EST NON AFRICAN AMERICAN: 43 mL/min — AB (ref >60)
Glucose, Bld: 91 mg/dL (ref 65–99)
POTASSIUM: 4.2 mmol/L (ref 3.5–5.1)
SODIUM: 136 mmol/L (ref 135–145)

## 2015-09-13 LAB — CBC WITH DIFFERENTIAL/PLATELET
BASOS ABS: 0 10*3/uL (ref 0.0–0.1)
BASOS PCT: 0 %
EOS ABS: 0.2 10*3/uL (ref 0.0–0.7)
Eosinophils Relative: 2 %
HCT: 36.1 % (ref 36.0–46.0)
HEMOGLOBIN: 10.9 g/dL — AB (ref 12.0–15.0)
LYMPHS ABS: 1.4 10*3/uL (ref 0.7–4.0)
Lymphocytes Relative: 13 %
MCH: 23.6 pg — ABNORMAL LOW (ref 26.0–34.0)
MCHC: 30.2 g/dL (ref 30.0–36.0)
MCV: 78.3 fL (ref 78.0–100.0)
Monocytes Absolute: 1.1 10*3/uL — ABNORMAL HIGH (ref 0.1–1.0)
Monocytes Relative: 10 %
NEUTROS PCT: 75 %
Neutro Abs: 8.1 10*3/uL — ABNORMAL HIGH (ref 1.7–7.7)
Platelets: 497 10*3/uL — ABNORMAL HIGH (ref 150–400)
RBC: 4.61 MIL/uL (ref 3.87–5.11)
RDW: 16.6 % — ABNORMAL HIGH (ref 11.5–15.5)
WBC: 10.8 10*3/uL — AB (ref 4.0–10.5)

## 2015-09-13 LAB — URINALYSIS, ROUTINE W REFLEX MICROSCOPIC
Bilirubin Urine: NEGATIVE
Glucose, UA: NEGATIVE mg/dL
HGB URINE DIPSTICK: NEGATIVE
KETONES UR: 15 mg/dL — AB
LEUKOCYTES UA: NEGATIVE
Nitrite: NEGATIVE
PROTEIN: NEGATIVE mg/dL
Specific Gravity, Urine: 1.027 (ref 1.005–1.030)
UROBILINOGEN UA: 0.2 mg/dL (ref 0.0–1.0)
pH: 5 (ref 5.0–8.0)

## 2015-09-13 MED ORDER — ACETAMINOPHEN 500 MG PO TABS
1000.0000 mg | ORAL_TABLET | Freq: Once | ORAL | Status: AC
  Administered 2015-09-13: 1000 mg via ORAL
  Filled 2015-09-13: qty 2

## 2015-09-13 NOTE — Discharge Instructions (Signed)
Fall Prevention and Home Safety °Falls cause injuries and can affect all age groups. It is possible to prevent falls.  °HOW TO PREVENT FALLS °· Wear shoes with rubber soles that do not have an opening for your toes. °· Keep the inside and outside of your house well lit. °· Use night lights throughout your home. °· Remove clutter from floors. °· Clean up floor spills. °· Remove throw rugs or fasten them to the floor with carpet tape. °· Do not place electrical cords across pathways. °· Put grab bars by your tub, shower, and toilet. Do not use towel bars as grab bars. °· Put handrails on both sides of the stairway. Fix loose handrails. °· Do not climb on stools or stepladders, if possible. °· Do not wax your floors. °· Repair uneven or unsafe sidewalks, walkways, or stairs. °· Keep items you use a lot within reach. °· Be aware of pets. °· Keep emergency numbers next to the telephone. °· Put smoke detectors in your home and near bedrooms. °Ask your doctor what other things you can do to prevent falls. °Document Released: 10/08/2009 Document Revised: 06/12/2012 Document Reviewed: 03/13/2012 °[employer]® Patient Information ©2015 [employer]. This information is not intended to replace advice given to you by your health care provider. Make sure you discuss any questions you have with your health care provider. ° °

## 2015-09-13 NOTE — ED Notes (Signed)
MD removed C Collar. Pt resting in bed with family at bedside

## 2015-09-13 NOTE — ED Provider Notes (Signed)
CSN: [id number]     Arrival date & time 09/13/15  1914 History   First MD Initiated Contact with Patient 09/13/15 0725     Chief Complaint  Patient presents with  . Fall     (Consider location/radiation/quality/duration/timing/severity/associated sxs/prior Treatment) Patient is a 81 y.o. female presenting with fall. The history is provided by the patient.  Fall This is a new problem. The current episode started less than 1 hour ago. The problem occurs rarely. The problem has been resolved. Associated symptoms include headaches. Pertinent negatives include no chest pain and no shortness of breath. Nothing aggravates the symptoms. Nothing relieves the symptoms. She has tried nothing for the symptoms. The treatment provided no relief.   81 yo F with a chief complaint of a fall. Patient thinks that she turned too [place] her balance and fell backwards. Denies loss of consciousness. Denies sensation like she was in a pass out. Patient however is mildly confused about the event. Has chronic bilateral knee pain which is unchanged. Patient has some mild upper T-spine pain. Has some discomfort she believes secondary to her c-collar.  Past Medical History  Diagnosis Date  . Hypertension   . Dyslipidemia   . Hemorrhoid   . Chronic pain     bil legs   Past Surgical History  Procedure Laterality Date  . Cholecystectomy    . Flexible sigmoidoscopy  11/24/2012    Procedure: FLEXIBLE SIGMOIDOSCOPY;  Surgeon: Missy Sabins, MD;  Location: [place];  Service: Endoscopy;  Laterality: N/A;   Family History  Problem Relation Age of Onset  . Breast cancer Daughter    Social History  Substance Use Topics  . Smoking status: Never Smoker   . Smokeless tobacco: None  . Alcohol Use: No   OB History    No data available     Review of Systems  Constitutional: Negative for fever and chills.  HENT: Negative for congestion and rhinorrhea.   Eyes: Negative for redness and visual disturbance.   Respiratory: Negative for shortness of breath and wheezing.   Cardiovascular: Negative for chest pain and palpitations.  Gastrointestinal: Negative for nausea and vomiting.  Genitourinary: Negative for dysuria and urgency.  Musculoskeletal: Positive for myalgias, back pain and arthralgias.  Skin: Negative for pallor and wound.  Neurological: Positive for headaches. Negative for dizziness.      Allergies  Review of patient's allergies indicates no known allergies.  Home Medications   Prior to Admission medications   Medication Sig Start Date End Date Taking? Authorizing Provider  acetaminophen (TYLENOL) 500 MG tablet Take 500 mg by mouth every 6 (six) hours as needed for mild pain.   Yes Historical Provider, MD  amLODipine (NORVASC) 5 MG tablet Take 5 mg by mouth daily.   Yes Historical Provider, MD  aspirin EC 81 MG tablet Take 81 mg by mouth daily.   Yes Historical Provider, MD  atorvastatin (LIPITOR) 20 MG tablet Take 20 mg by mouth daily.   Yes Historical Provider, MD  clorazepate (TRANXENE) 3.75 MG tablet Take 3.75 mg by mouth 3 (three) times daily.   Yes Historical Provider, MD  docusate sodium (COLACE) 100 MG capsule Take 100 mg by mouth every morning.   Yes Historical Provider, MD  halobetasol (ULTRAVATE) 0.05 % cream Apply 1 application topically at bedtime.   Yes Historical Provider, MD  hydrochlorothiazide (HYDRODIURIL) 25 MG tablet Take 25 mg by mouth every morning.   Yes Historical Provider, MD  hydrocortisone cream 1 % Apply 1 application  topically 3 (three) times daily.   Yes Historical Provider, MD  levofloxacin (LEVAQUIN) 500 MG tablet Take 500 mg by mouth daily.   Yes Historical Provider, MD  losartan (COZAAR) 50 MG tablet Take 50 mg by mouth every morning.   Yes Historical Provider, MD  meloxicam (MOBIC) 15 MG tablet Take 15 mg by mouth every morning.   Yes Historical Provider, MD  metFORMIN (GLUCOPHAGE) 1000 MG tablet Take 1,000 mg by mouth 2 (two) times daily with  a meal.   Yes Historical Provider, MD  Multiple Vitamins-Iron (MULTIVITAMINS WITH IRON) TABS tablet Take 1 tablet by mouth daily.   Yes Historical Provider, MD  Multiple Vitamins-Minerals (ICAPS PO) Take 1 capsule by mouth daily.   Yes Historical Provider, MD  omeprazole (PRILOSEC) 20 MG capsule Take 20 mg by mouth daily.   Yes Historical Provider, MD  polyethylene glycol (MIRALAX / GLYCOLAX) packet Take 17 g by mouth every morning.   Yes Historical Provider, MD  predniSONE (DELTASONE) 5 MG tablet Take 5 mg by mouth daily with breakfast.   Yes Historical Provider, MD  pseudoephedrine-dextromethorphan-guaifenesin (ROBITUSSIN-PE) 30-10-100 MG/5ML solution Take 10 mLs by mouth 4 (four) times daily as needed for cough.   Yes Historical Provider, MD  ramelteon (ROZEREM) 8 MG tablet Take 8 mg by mouth at bedtime.   Yes Historical Provider, MD  traMADol (ULTRAM) 50 MG tablet Take 50 mg by mouth every 6 (six) hours as needed for moderate pain.   Yes Historical Provider, MD  valsartan-hydrochlorothiazide (DIOVAN-HCT) 320-25 MG per tablet Take 1 tablet by mouth daily.    Historical Provider, MD   BP 117/48 mmHg  Pulse 73  Temp(Src) 97.7 F (36.5 C) (Oral)  Resp 21  Ht '5\' 4"'$  (1.626 m)  Wt 150 lb (68.04 kg)  BMI 25.73 kg/m2  SpO2 92% Physical Exam  Constitutional: She is oriented to person, place, and time. She appears well-developed and well-nourished. No distress.  HENT:  Head: Normocephalic and atraumatic.  Eyes: EOM are normal. Pupils are equal, round, and reactive to light.  Neck: Normal range of motion. Neck supple.  Cardiovascular: Normal rate and regular rhythm.  Exam reveals no gallop and no friction rub.   No murmur heard. Pulmonary/Chest: Effort normal. She has no wheezes. She has no rales.  Abdominal: Soft. She exhibits no distension. There is no tenderness. There is no rebound and no guarding.  Musculoskeletal: She exhibits tenderness (Mild tenderness palpation about the upper  paraspinal thoracic musculature no noted midline tenderness). She exhibits no edema.  Neurological: She is alert and oriented to person, place, and time.  Skin: Skin is warm and dry. She is not diaphoretic.  Psychiatric: She has a normal mood and affect. Her behavior is normal.    ED Course  Procedures (including critical care time) Labs Review Labs Reviewed  CBC WITH DIFFERENTIAL/PLATELET - Abnormal; Notable for the following:    WBC 10.8 (*)    Hemoglobin 10.9 (*)    MCH 23.6 (*)    RDW 16.6 (*)    Platelets 497 (*)    Neutro Abs 8.1 (*)    Monocytes Absolute 1.1 (*)    All other components within normal limits  BASIC METABOLIC PANEL - Abnormal; Notable for the following:    Chloride 97 (*)    BUN 35 (*)    Creatinine, Ser 1.06 (*)    GFR calc non Af Amer 43 (*)    GFR calc Af Amer 50 (*)    All other  components within normal limits  URINALYSIS, ROUTINE W REFLEX MICROSCOPIC (NOT AT [facility]) - Abnormal; Notable for the following:    Ketones, ur 15 (*)    All other components within normal limits    Imaging Review Dg Chest 2 View  09/13/2015   CLINICAL DATA:  Pt states she fell this morning on a concrete floor; she complains of upper mid-back pain since the fall; she denies chest pain; h/o HTN  EXAM: CHEST - 2 VIEW  COMPARISON:  CT 07/09/2014  FINDINGS: 8.7 cm left suprahilar mass with effacement of the AP window and aortic knob (previously 4.3 cm). Right lung clear. Heart size normal. No effusion.  No pneumothorax. Spondylitic changes in the mid and lower thoracic spine.  IMPRESSION: 1. Interval enlargement of left suprahilar mass, now 8.7 cm.   Electronically Signed   By: Lucrezia Europe M.D.   On: 09/13/2015 09:28   Dg Thoracic Spine W/swimmers  09/13/2015   CLINICAL DATA:  Pt states she fell this morning on a concrete floor; she complains of upper mid-back pain since the fall; she denies chest pain  EXAM: THORACIC SPINE - 3 VIEWS  COMPARISON:  CT 07/09/2014  FINDINGS: Anterior  endplate spurring at multiple contiguous levels in the lower cervical, mid and lower thoracic spine. Normal alignment. Negative for fracture. 8.4 cm left suprahilar mass. Surgical clips right upper abdomen. Right renal and left subclavian arterial stents.  IMPRESSION: 1. Negative for fracture or other acute thoracic abnormality. 2. 8.4 cm left suprahilar mass, as previously described.   Electronically Signed   By: Lucrezia Europe M.D.   On: 09/13/2015 09:30   Ct Head Wo Contrast  09/13/2015   CLINICAL DATA:  Patient fell in her room describing neck pain and tenderness currently  EXAM: CT HEAD WITHOUT CONTRAST  CT CERVICAL SPINE WITHOUT CONTRAST  TECHNIQUE: Multidetector CT imaging of the head and cervical spine was performed following the standard protocol without intravenous contrast. Multiplanar CT image reconstructions of the cervical spine were also generated.  COMPARISON:  07/09/2014  FINDINGS: CT HEAD FINDINGS  Diffuse age-related atrophy and low attenuation in the deep white matter. Evidence of prior lacunar infarct left basal ganglia stable. No evidence of acute vascular territory infarct. No hemorrhage or extra-axial fluid. No hydrocephalus. No skull fracture.  CT CERVICAL SPINE FINDINGS  No fracture. Severe multilevel degenerative disc disease and degenerative facet change. Severe hypertrophic degenerative change of the atlantodental interval causing mild canal narrowing at this level. No fracture. No prevertebral soft tissue swelling. No acute soft tissue abnormalities. Partially visualized left upper lobe infiltrate in the left lung.  IMPRESSION: 1. No acute intracranial abnormality. Chronic involutional change and prior infarcts noted. 2. No cervical spine fracture.  Advanced degenerative changes. 3. Partially visualized infiltrate in the upper lobe of the left lung. Acute pneumonia/pneumonitis not excluded. This is new from 07/09/2014. It is recognized that 07/09/2014 CT scan demonstrated a mass in the  left upper lobe suspicious for bronchogenic carcinoma and if the patient has undergone surgery and/or radiation, the partially visualized infiltrate could be related to either of these. Correlate clinically and consider CT thorax.   Electronically Signed   By: Skipper Cliche M.D.   On: 09/13/2015 09:13   Ct Cervical Spine Wo Contrast  09/13/2015   CLINICAL DATA:  Patient fell in her room describing neck pain and tenderness currently  EXAM: CT HEAD WITHOUT CONTRAST  CT CERVICAL SPINE WITHOUT CONTRAST  TECHNIQUE: Multidetector CT imaging of the head and cervical  spine was performed following the standard protocol without intravenous contrast. Multiplanar CT image reconstructions of the cervical spine were also generated.  COMPARISON:  07/09/2014  FINDINGS: CT HEAD FINDINGS  Diffuse age-related atrophy and low attenuation in the deep white matter. Evidence of prior lacunar infarct left basal ganglia stable. No evidence of acute vascular territory infarct. No hemorrhage or extra-axial fluid. No hydrocephalus. No skull fracture.  CT CERVICAL SPINE FINDINGS  No fracture. Severe multilevel degenerative disc disease and degenerative facet change. Severe hypertrophic degenerative change of the atlantodental interval causing mild canal narrowing at this level. No fracture. No prevertebral soft tissue swelling. No acute soft tissue abnormalities. Partially visualized left upper lobe infiltrate in the left lung.  IMPRESSION: 1. No acute intracranial abnormality. Chronic involutional change and prior infarcts noted. 2. No cervical spine fracture.  Advanced degenerative changes. 3. Partially visualized infiltrate in the upper lobe of the left lung. Acute pneumonia/pneumonitis not excluded. This is new from 07/09/2014. It is recognized that 07/09/2014 CT scan demonstrated a mass in the left upper lobe suspicious for bronchogenic carcinoma and if the patient has undergone surgery and/or radiation, the partially visualized  infiltrate could be related to either of these. Correlate clinically and consider CT thorax.   Electronically Signed   By: Skipper Cliche M.D.   On: 09/13/2015 09:13   I have personally reviewed and evaluated these images and lab results as part of my medical decision-making.   EKG Interpretation   Date/Time:  Sunday September 13 2015 07:52:13 EDT Ventricular Rate:  72 PR Interval:  213 QRS Duration: 69 QT Interval:  381 QTC Calculation: 417 R Axis:   -32 Text Interpretation:  Sinus rhythm Borderline prolonged PR interval Left  axis deviation Anterior infarct, old no wpw, prolonged qt, brugada No  significant change since last tracing Confirmed by FLOYD MD, DANIEL  [phone]) on 09/13/2015 7:56:05 AM      MDM   Final diagnoses:  Fall, initial encounter  Malignant neoplasm of upper lobe of left lung    81 yo F fell from standing. Not on any medications at home. We'll obtain a CT of the head C-spine. Plain film T-spine and chest x-ray. Basic labs and urine.  Enlarged lung ca noted on cxr.   Discussed with family.  Currently not undergoing therapy.  CT c spine with ? Pneumonia in LUL.  Patient with some vague symptoms, will hold off on treating with abx at this time.  PCP follow up.    I have discussed the diagnosis/risks/treatment options with the patient and believe the pt to be eligible for discharge home to follow-up with PCP. We also discussed returning to the ED immediately if new or worsening sx occur. We discussed the sx which are most concerning (e.g., Recurrent event, syncope) that necessitate immediate return. Medications administered to the patient during their visit and any new prescriptions provided to the patient are listed below.  Medications given during this visit Medications  acetaminophen (TYLENOL) tablet 1,000 mg (1,000 mg Oral Given 09/13/15 0747)    Discharge Medication List as of 09/13/2015  9:52 AM       The patient appears reasonably screen and/or  stabilized for discharge and I doubt any other medical condition or other [facility] requiring further screening, evaluation, or treatment in the ED at this time prior to discharge.    Deno Etienne, DO 09/13/15 1151

## 2015-09-13 NOTE — ED Notes (Signed)
Information per EMS staff. PT lives at [facility]  . Ems was called to transport pt after a reported fall . Pt's room mated called front desk after hearing her fall in room. Pt approx. Down time 55mns. [facility] did [facility] that [facility] and tenderness was [facility]. Pt also reported chronic pain to Bil. Legs unchanged Pt arrived to ED in neck brace and board.

## 2015-09-16 DIAGNOSIS — J209 Acute bronchitis, unspecified: Secondary | ICD-10-CM | POA: Diagnosis not present

## 2015-09-16 DIAGNOSIS — R269 Unspecified abnormalities of gait and mobility: Secondary | ICD-10-CM | POA: Diagnosis not present

## 2015-09-16 DIAGNOSIS — G894 Chronic pain syndrome: Secondary | ICD-10-CM | POA: Diagnosis not present

## 2015-09-16 DIAGNOSIS — E119 Type 2 diabetes mellitus without complications: Secondary | ICD-10-CM | POA: Diagnosis not present

## 2015-09-30 DIAGNOSIS — R05 Cough: Secondary | ICD-10-CM | POA: Diagnosis not present

## 2015-09-30 DIAGNOSIS — F419 Anxiety disorder, unspecified: Secondary | ICD-10-CM | POA: Diagnosis not present

## 2015-09-30 DIAGNOSIS — Z79899 Other long term (current) drug therapy: Secondary | ICD-10-CM | POA: Diagnosis not present

## 2015-09-30 DIAGNOSIS — C3490 Malignant neoplasm of unspecified part of unspecified bronchus or lung: Secondary | ICD-10-CM | POA: Diagnosis not present

## 2015-11-26 DEATH — deceased

## 2016-05-18 IMAGING — CR DG CHEST 2V
2 series · 2 of 2 positions shown · non-contrast
Comparison: CT 07/09/2014

CLINICAL DATA: Pt states she fell this morning on a concrete floor;
she complains of upper mid-back pain since the fall; she denies
chest pain; h/o HTN

EXAM:
CHEST - 2 VIEW

[chest lat]
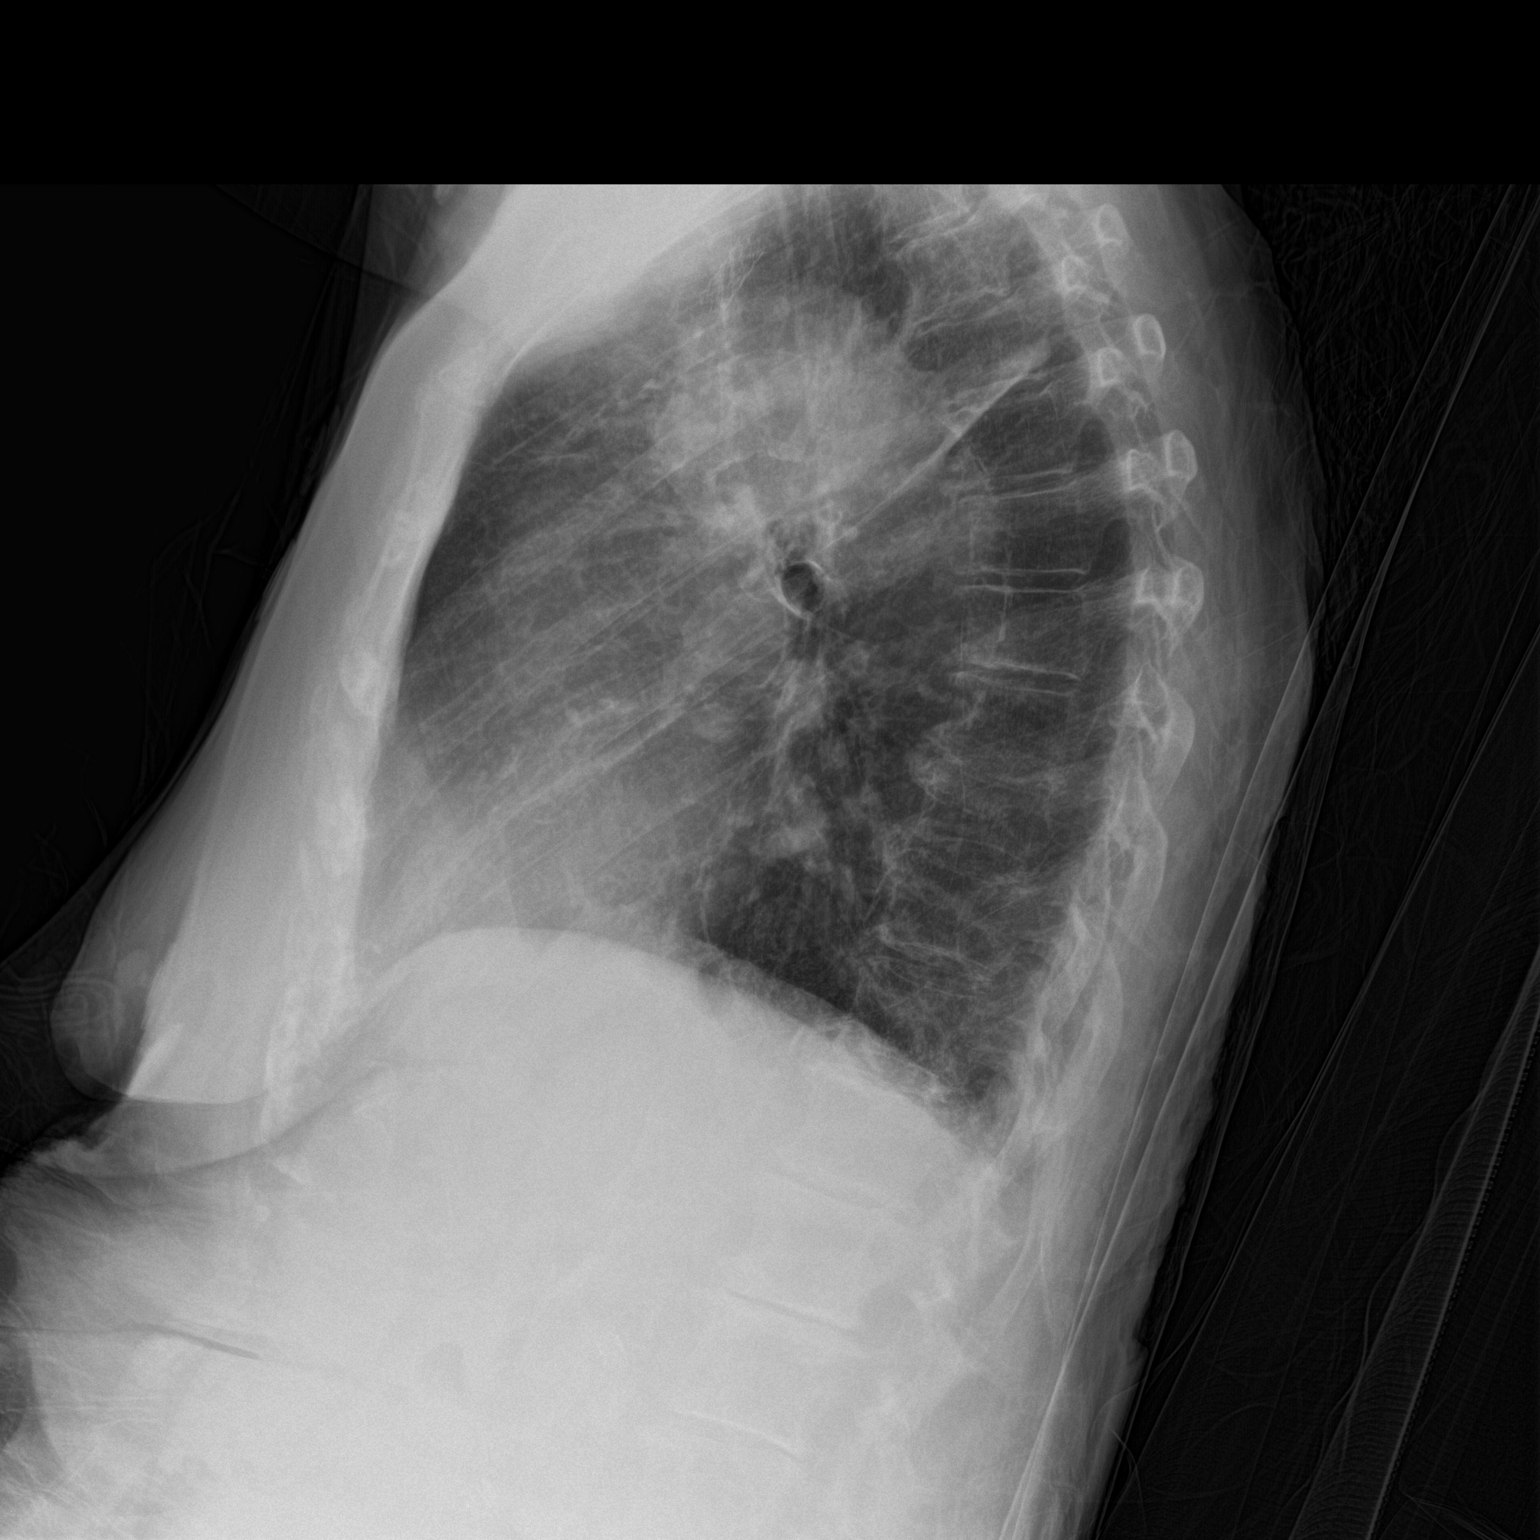

[chest ap]
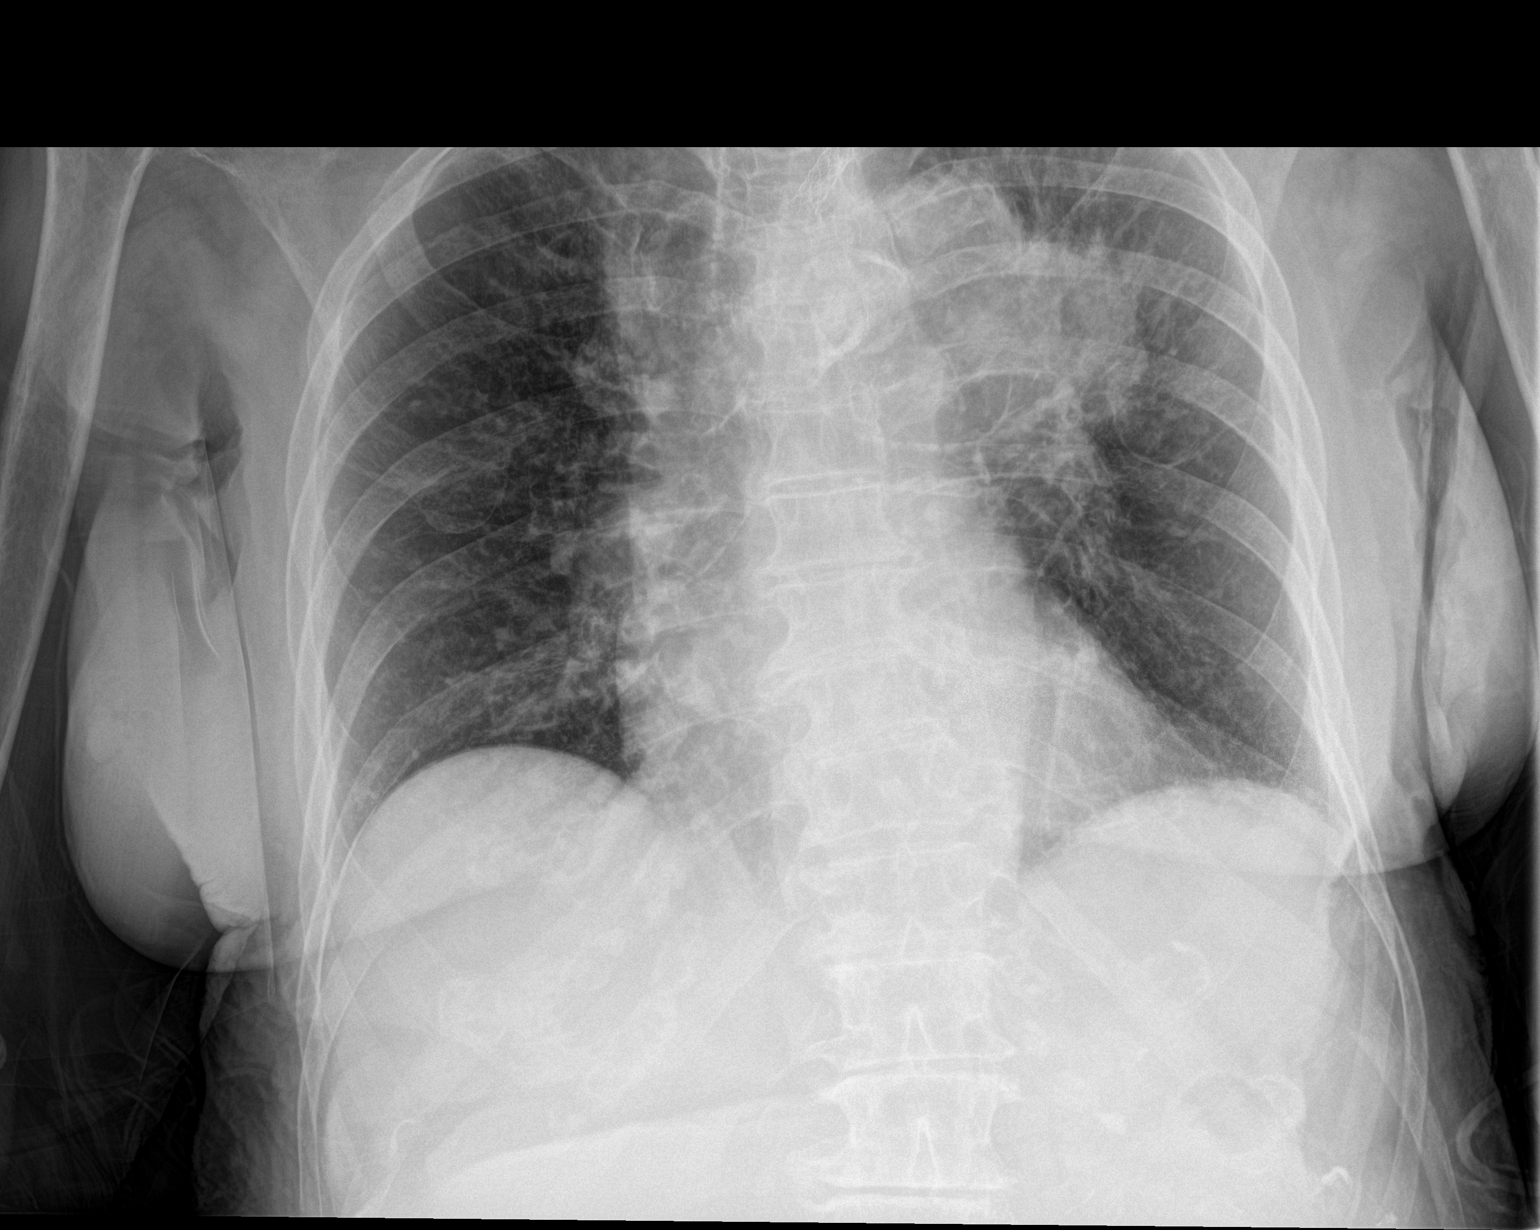

[2 of 2 positions shown; findings below may reference images not displayed]

FINDINGS: 8.7 cm left suprahilar mass with effacement of the AP window and
aortic knob (previously 4.3 cm). Right lung clear. Heart size
normal.
No effusion.  No pneumothorax.
Spondylitic changes in the mid and lower thoracic spine.
IMPRESSION: 1. Interval enlargement of left suprahilar mass, now 8.7 cm.

## 2016-05-18 IMAGING — CT CT HEAD W/O CM
1 of 5 series · 7 of 37 positions shown, 9 images · non-contrast
Comparison: 07/09/2014

CLINICAL DATA: Patient fell in her room describing neck pain and
tenderness currently

EXAM:
CT HEAD WITHOUT CONTRAST
CT CERVICAL SPINE WITHOUT CONTRAST
TECHNIQUE: Multidetector CT imaging of the head and cervical spine was
performed following the standard protocol without intravenous
contrast. Multiplanar CT image reconstructions of the cervical spine
were also generated.

[Series 308: ortho · axial · 0.43mm/px · z∈[+68,+166]mm · 7 of 73 slices shown, 9 images]
[im 10/73  brain]
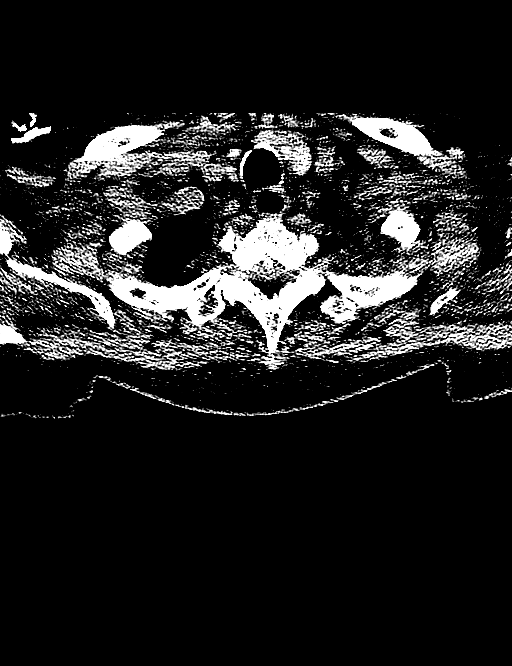
[im 10/73  bone]
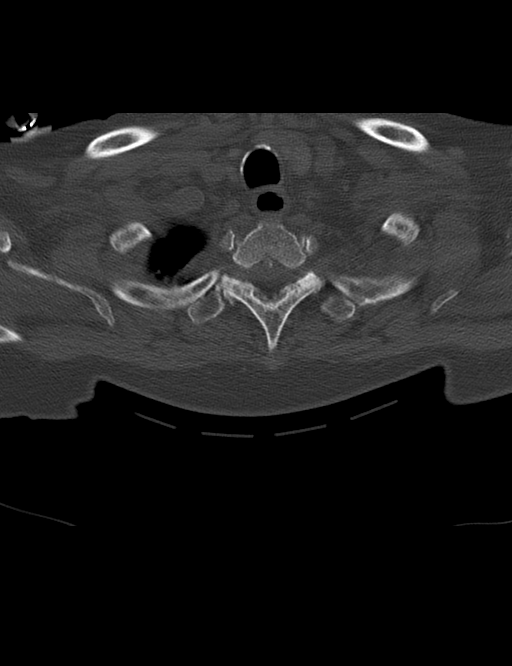
[im 19/73  brain]
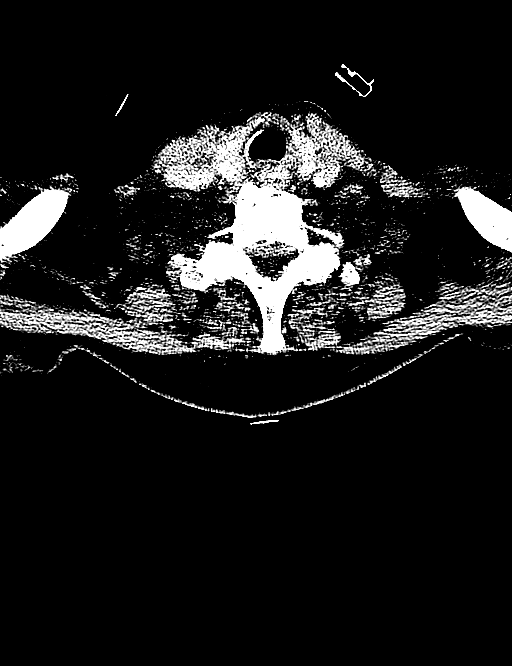
[im 28/73  brain]
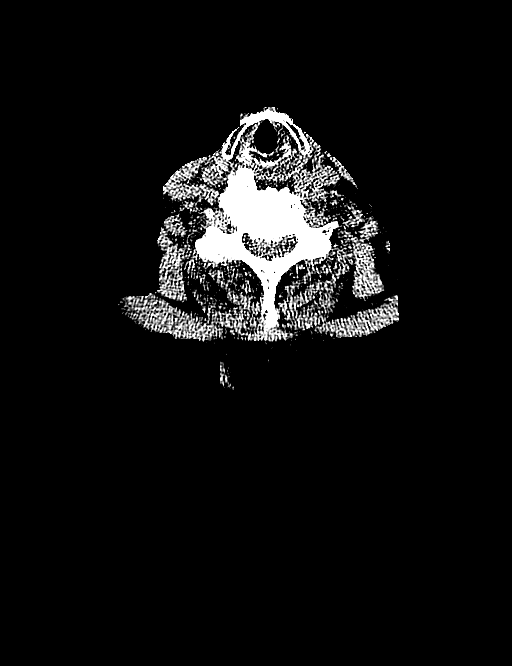
[im 37/73  brain]
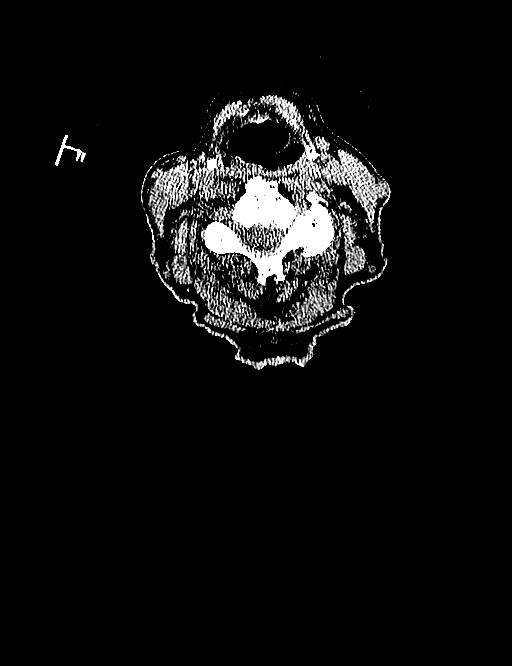
[im 46/73  brain]
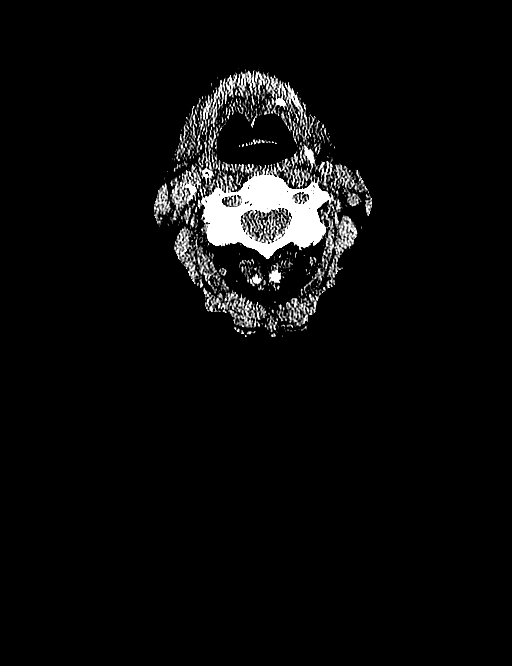
[im 46/73  bone]
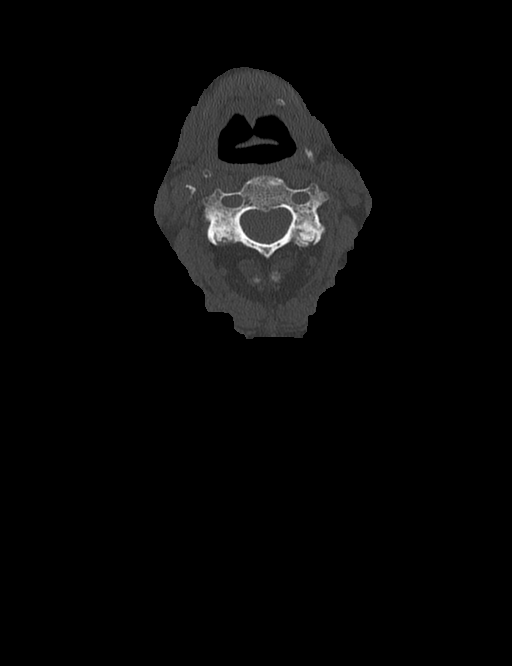
[im 55/73  brain]
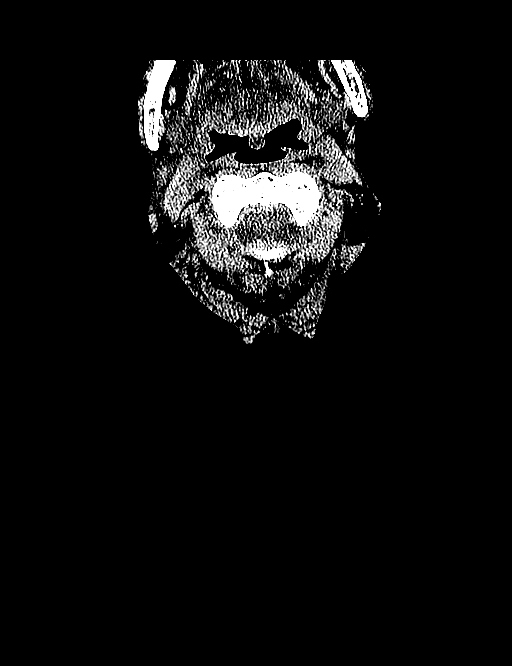
[im 64/73  brain]
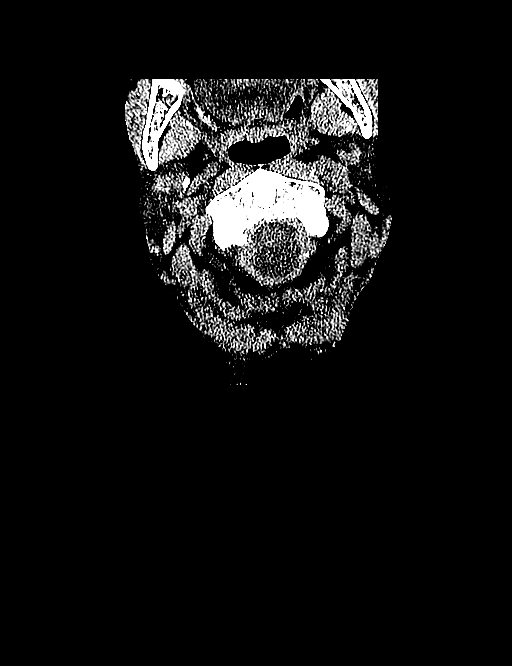

[7 of 37 positions shown; findings below may reference images not displayed]

FINDINGS: CT HEAD FINDINGS

Diffuse age-related atrophy and low attenuation in the deep white
matter. Evidence of prior lacunar infarct left basal ganglia stable.
No evidence of acute vascular territory infarct. No hemorrhage or
extra-axial fluid. No hydrocephalus. No skull fracture.

CT CERVICAL SPINE FINDINGS

No fracture. Severe multilevel degenerative disc disease and
degenerative facet change. Severe hypertrophic degenerative change
of the atlantodental interval causing mild canal narrowing at this
level. No fracture. No prevertebral soft tissue swelling. No acute
soft tissue abnormalities. Partially visualized left upper lobe
infiltrate in the left lung.
IMPRESSION: 1. No acute intracranial abnormality. Chronic involutional change
and prior infarcts noted.
2. No cervical spine fracture.  Advanced degenerative changes.
3. Partially visualized infiltrate in the upper lobe of the left
lung. Acute pneumonia/pneumonitis not excluded. This is new from
07/09/2014. It is recognized that 07/09/2014 CT scan demonstrated a
mass in the left upper lobe suspicious for bronchogenic carcinoma
and if the patient has undergone surgery and/or radiation, the
partially visualized infiltrate could be related to either of these.
Correlate clinically and consider CT thorax.
